# Patient Record
Sex: Male | Born: 1935 | Race: White | Hispanic: No | Smoking: Former smoker
Health system: Southern US, Community
[De-identification: ages and names within clinical notes are randomized; demographics above are authoritative.]

## PROBLEM LIST (undated history)

## (undated) DIAGNOSIS — I2510 Atherosclerotic heart disease of native coronary artery without angina pectoris: Secondary | ICD-10-CM

## (undated) DIAGNOSIS — I10 Essential (primary) hypertension: Secondary | ICD-10-CM

## (undated) DIAGNOSIS — F419 Anxiety disorder, unspecified: Secondary | ICD-10-CM

## (undated) DIAGNOSIS — F0390 Unspecified dementia without behavioral disturbance: Secondary | ICD-10-CM

## (undated) DIAGNOSIS — I219 Acute myocardial infarction, unspecified: Secondary | ICD-10-CM

## (undated) DIAGNOSIS — H269 Unspecified cataract: Secondary | ICD-10-CM

## (undated) DIAGNOSIS — E785 Hyperlipidemia, unspecified: Secondary | ICD-10-CM

## (undated) DIAGNOSIS — I6529 Occlusion and stenosis of unspecified carotid artery: Secondary | ICD-10-CM

## (undated) DIAGNOSIS — E119 Type 2 diabetes mellitus without complications: Secondary | ICD-10-CM

## (undated) DIAGNOSIS — F5104 Psychophysiologic insomnia: Secondary | ICD-10-CM

## (undated) DIAGNOSIS — M1990 Unspecified osteoarthritis, unspecified site: Secondary | ICD-10-CM

## (undated) HISTORY — DX: Anxiety disorder, unspecified: F41.9

## (undated) HISTORY — DX: Unspecified osteoarthritis, unspecified site: M19.90

## (undated) HISTORY — DX: Psychophysiologic insomnia: F51.04

## (undated) HISTORY — DX: Essential (primary) hypertension: I10

## (undated) HISTORY — DX: Acute myocardial infarction, unspecified: I21.9

## (undated) HISTORY — DX: Occlusion and stenosis of unspecified carotid artery: I65.29

## (undated) HISTORY — DX: Atherosclerotic heart disease of native coronary artery without angina pectoris: I25.10

## (undated) HISTORY — DX: Hyperlipidemia, unspecified: E78.5

## (undated) HISTORY — PX: KNEE ARTHROSCOPY: SUR90

## (undated) HISTORY — PX: CORONARY ARTERY BYPASS GRAFT: SHX141

## (undated) HISTORY — PX: CATARACT EXTRACTION: SUR2

## (undated) HISTORY — DX: Unspecified dementia, unspecified severity, without behavioral disturbance, psychotic disturbance, mood disturbance, and anxiety: F03.90

## (undated) HISTORY — PX: CAROTID ENDARTERECTOMY: SUR193

## (undated) HISTORY — DX: Unspecified cataract: H26.9

---

## 1998-12-11 ENCOUNTER — Ambulatory Visit (HOSPITAL_COMMUNITY): Admission: RE | Admit: 1998-12-11 | Discharge: 1998-12-11 | Payer: Self-pay | Admitting: Interventional Cardiology

## 1998-12-27 ENCOUNTER — Encounter: Payer: Self-pay | Admitting: Surgery

## 1998-12-28 ENCOUNTER — Encounter: Payer: Self-pay | Admitting: Surgery

## 1998-12-28 ENCOUNTER — Inpatient Hospital Stay (HOSPITAL_COMMUNITY): Admission: RE | Admit: 1998-12-28 | Discharge: 1999-01-06 | Payer: Self-pay | Admitting: Surgery

## 1998-12-29 ENCOUNTER — Encounter: Payer: Self-pay | Admitting: Surgery

## 1998-12-30 ENCOUNTER — Encounter: Payer: Self-pay | Admitting: Surgery

## 1998-12-31 ENCOUNTER — Encounter: Payer: Self-pay | Admitting: Surgery

## 1999-01-02 ENCOUNTER — Encounter: Payer: Self-pay | Admitting: Surgery

## 1999-01-04 ENCOUNTER — Encounter: Payer: Self-pay | Admitting: Surgery

## 1999-01-05 ENCOUNTER — Encounter: Payer: Self-pay | Admitting: Surgery

## 1999-01-06 ENCOUNTER — Encounter: Payer: Self-pay | Admitting: Surgery

## 2003-07-13 ENCOUNTER — Ambulatory Visit (HOSPITAL_COMMUNITY): Admission: RE | Admit: 2003-07-13 | Discharge: 2003-07-13 | Payer: Self-pay | Admitting: Internal Medicine

## 2003-09-15 ENCOUNTER — Ambulatory Visit (HOSPITAL_COMMUNITY): Admission: RE | Admit: 2003-09-15 | Discharge: 2003-09-15 | Payer: Self-pay | Admitting: Gastroenterology

## 2006-02-28 ENCOUNTER — Encounter: Admission: RE | Admit: 2006-02-28 | Discharge: 2006-02-28 | Payer: Self-pay | Admitting: Internal Medicine

## 2006-04-01 ENCOUNTER — Ambulatory Visit (HOSPITAL_COMMUNITY): Admission: RE | Admit: 2006-04-01 | Discharge: 2006-04-01 | Payer: Self-pay | Admitting: Vascular Surgery

## 2007-06-05 ENCOUNTER — Ambulatory Visit: Payer: Self-pay | Admitting: Vascular Surgery

## 2007-12-07 ENCOUNTER — Ambulatory Visit: Payer: Self-pay | Admitting: Surgery

## 2007-12-09 ENCOUNTER — Ambulatory Visit: Payer: Self-pay | Admitting: Vascular Surgery

## 2008-01-05 ENCOUNTER — Encounter: Admission: RE | Admit: 2008-01-05 | Discharge: 2008-01-05 | Payer: Self-pay | Admitting: Interventional Cardiology

## 2008-01-07 ENCOUNTER — Inpatient Hospital Stay (HOSPITAL_BASED_OUTPATIENT_CLINIC_OR_DEPARTMENT_OTHER): Admission: RE | Admit: 2008-01-07 | Discharge: 2008-01-07 | Payer: Self-pay | Admitting: Interventional Cardiology

## 2008-01-14 ENCOUNTER — Inpatient Hospital Stay (HOSPITAL_COMMUNITY): Admission: RE | Admit: 2008-01-14 | Discharge: 2008-01-15 | Payer: Self-pay | Admitting: Interventional Cardiology

## 2008-01-19 ENCOUNTER — Encounter (INDEPENDENT_AMBULATORY_CARE_PROVIDER_SITE_OTHER): Payer: Self-pay | Admitting: Interventional Cardiology

## 2008-01-19 ENCOUNTER — Ambulatory Visit (HOSPITAL_COMMUNITY): Admission: RE | Admit: 2008-01-19 | Discharge: 2008-01-19 | Payer: Self-pay | Admitting: Interventional Cardiology

## 2008-01-21 ENCOUNTER — Inpatient Hospital Stay (HOSPITAL_COMMUNITY): Admission: EM | Admit: 2008-01-21 | Discharge: 2008-02-04 | Payer: Self-pay | Admitting: Emergency Medicine

## 2008-01-22 ENCOUNTER — Ambulatory Visit: Payer: Self-pay | Admitting: Vascular Surgery

## 2008-01-22 ENCOUNTER — Ambulatory Visit: Payer: Self-pay | Admitting: Infectious Diseases

## 2008-02-18 ENCOUNTER — Ambulatory Visit: Payer: Self-pay | Admitting: *Deleted

## 2008-02-29 ENCOUNTER — Telehealth: Payer: Self-pay | Admitting: Infectious Disease

## 2008-03-08 ENCOUNTER — Encounter: Payer: Self-pay | Admitting: Infectious Disease

## 2008-03-21 ENCOUNTER — Ambulatory Visit: Payer: Self-pay | Admitting: Infectious Disease

## 2008-03-21 DIAGNOSIS — L03319 Cellulitis of trunk, unspecified: Secondary | ICD-10-CM

## 2008-03-21 DIAGNOSIS — I6529 Occlusion and stenosis of unspecified carotid artery: Secondary | ICD-10-CM

## 2008-03-21 DIAGNOSIS — I253 Aneurysm of heart: Secondary | ICD-10-CM | POA: Insufficient documentation

## 2008-03-21 DIAGNOSIS — L02219 Cutaneous abscess of trunk, unspecified: Secondary | ICD-10-CM

## 2008-03-21 DIAGNOSIS — I2510 Atherosclerotic heart disease of native coronary artery without angina pectoris: Secondary | ICD-10-CM | POA: Insufficient documentation

## 2008-03-23 HISTORY — PX: CAROTID ENDARTERECTOMY: SUR193

## 2008-03-25 ENCOUNTER — Encounter: Payer: Self-pay | Admitting: Infectious Disease

## 2008-03-30 ENCOUNTER — Ambulatory Visit: Payer: Self-pay | Admitting: Vascular Surgery

## 2008-04-18 ENCOUNTER — Encounter: Payer: Self-pay | Admitting: Vascular Surgery

## 2008-04-18 ENCOUNTER — Inpatient Hospital Stay (HOSPITAL_COMMUNITY): Admission: RE | Admit: 2008-04-18 | Discharge: 2008-04-20 | Payer: Self-pay | Admitting: Vascular Surgery

## 2008-04-18 ENCOUNTER — Ambulatory Visit: Payer: Self-pay | Admitting: Vascular Surgery

## 2008-04-22 ENCOUNTER — Encounter: Payer: Self-pay | Admitting: Infectious Disease

## 2008-05-04 ENCOUNTER — Ambulatory Visit: Payer: Self-pay | Admitting: Vascular Surgery

## 2008-11-09 ENCOUNTER — Ambulatory Visit: Payer: Self-pay | Admitting: Vascular Surgery

## 2009-01-31 IMAGING — CR DG CHEST 1V PORT
1 series · 1 of 1 positions shown · non-contrast
Comparison: 01/05/08.

CLINICAL DATA: Fever. Groin hematoma.
 PORTABLE CHEST - 1 VIEW:

[AP]
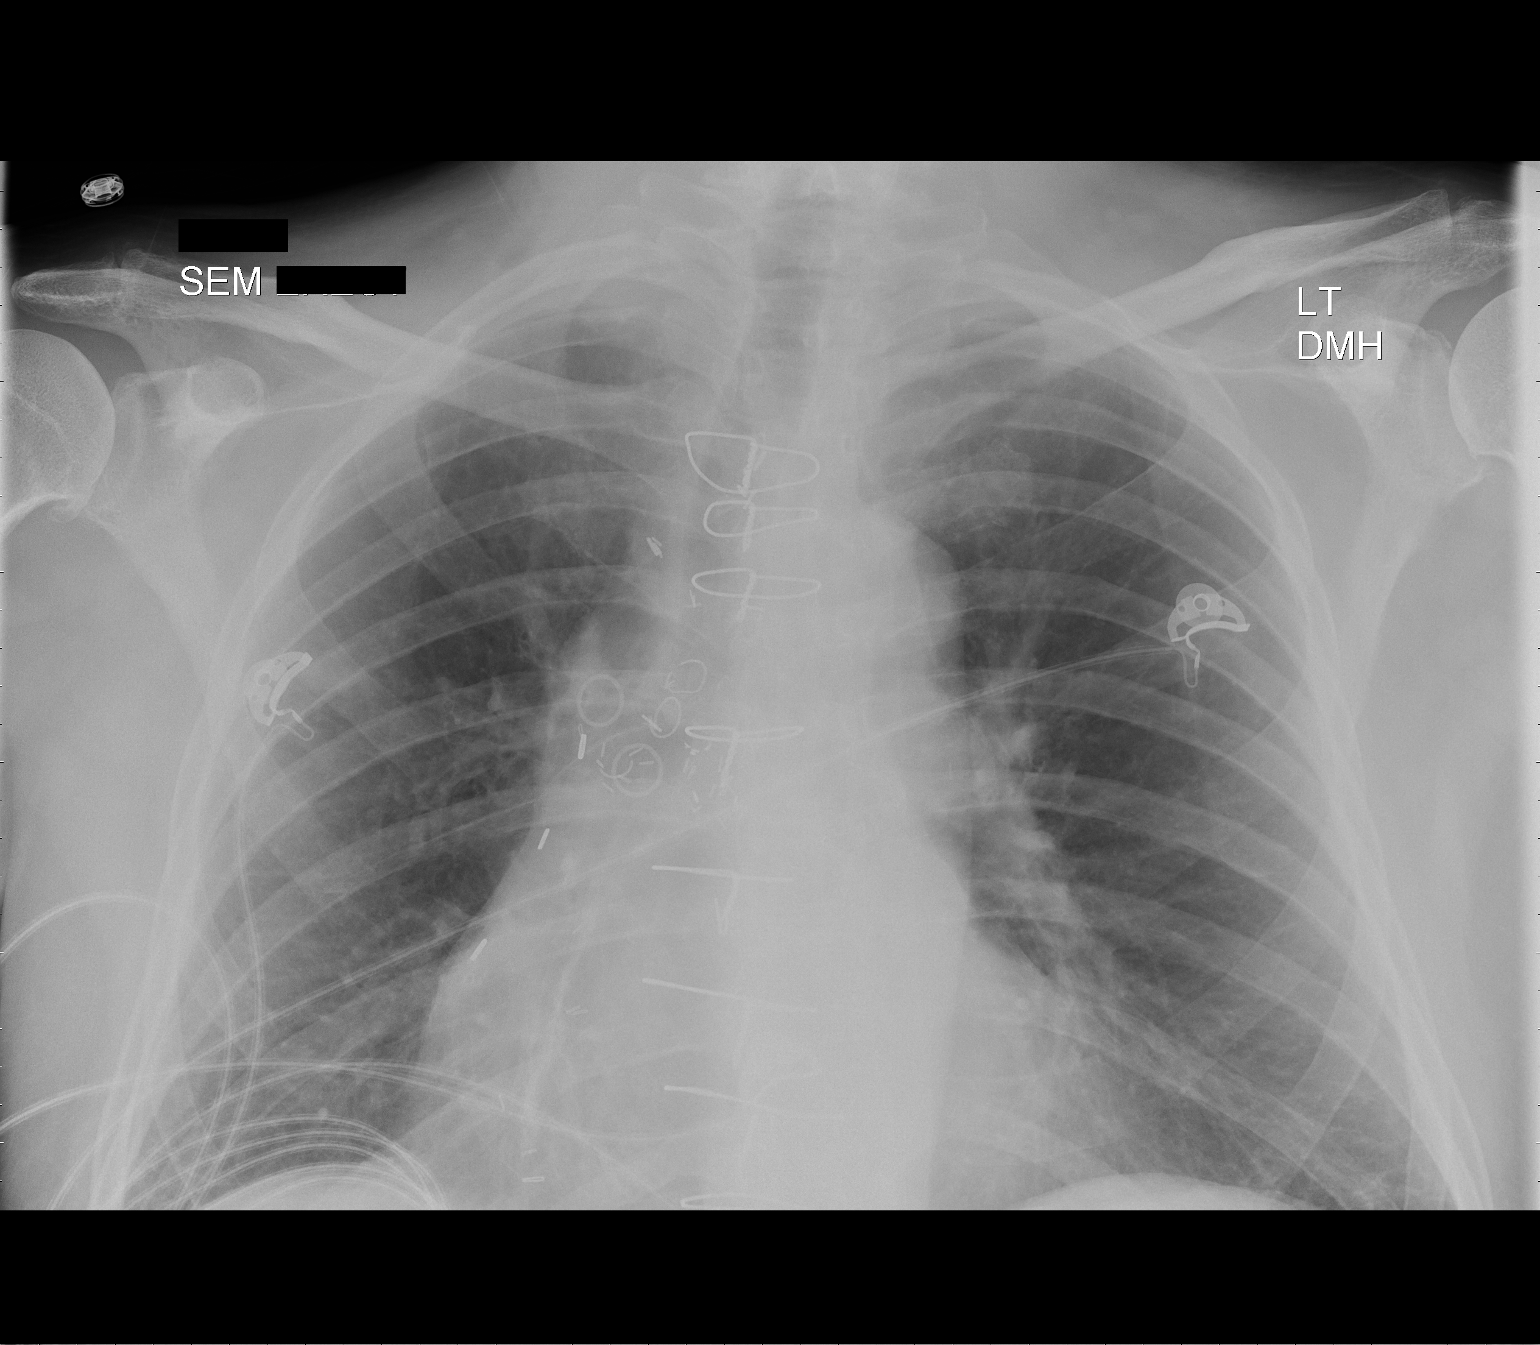

[1 of 1 positions shown; findings below may reference images not displayed]

FINDINGS: The lungs are clear. There is cardiomegaly.  No pleural effusion. No focal bony abnormality.
IMPRESSION: No acute disease or change.

## 2009-01-31 IMAGING — CT CT PELVIS W/O CM
3 of 4 series · 11 of 46 positions shown, 18 images · IV contrast (agent unspecified)
Comparison: none

CLINICAL DATA: Patient status-post cardiac catheterization with swelling of the right groin.  
PELVIS CT WITHOUT CONTRAST:
TECHNIQUE: Multidetector CT imaging of the pelvis was performed following the standard protocol without IV contrast.

[Series 2: routine pelvis · axial · 0.88mm/px · z∈[-316,-96]mm · 7 of 60 slices shown, 12 images]
[im 8/60  soft-tissue]
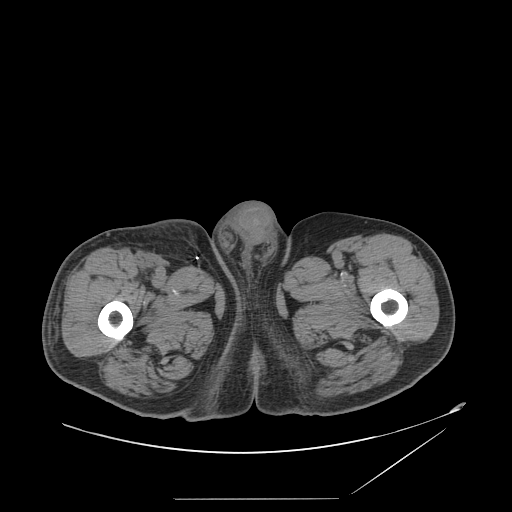
[im 8/60  bone]
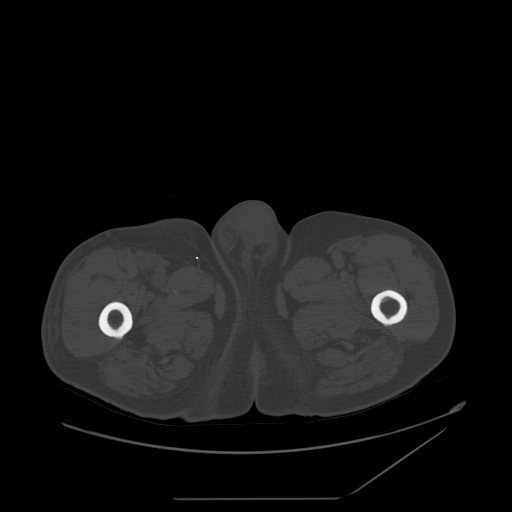
[im 15/60  soft-tissue]
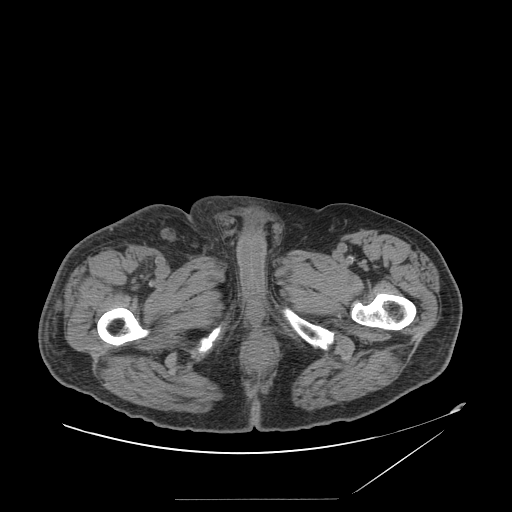
[im 23/60  soft-tissue]
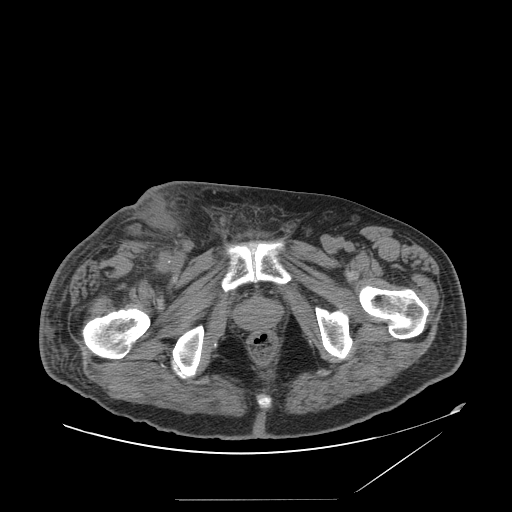
[im 30/60  soft-tissue]
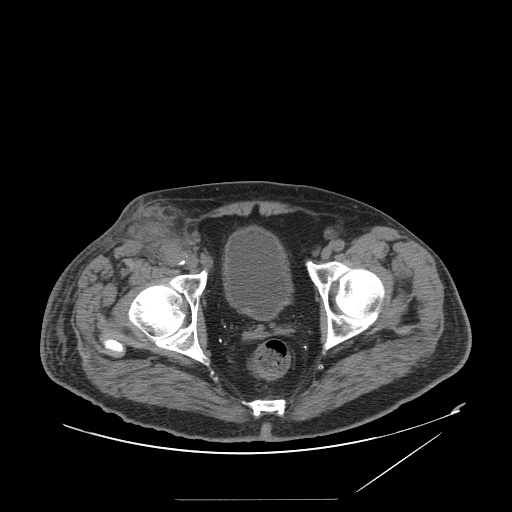
[im 30/60  lung]
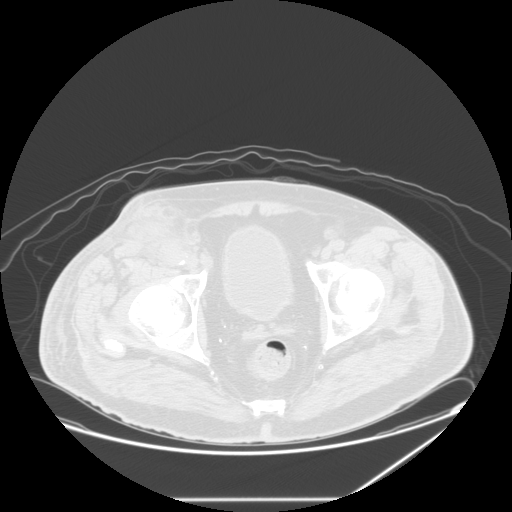
[im 37/60  soft-tissue]
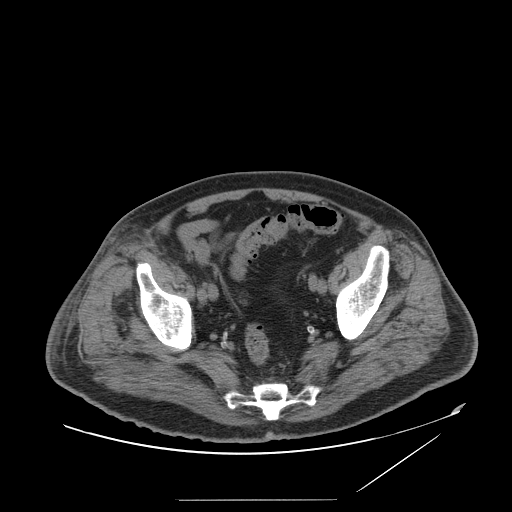
[im 37/60  lung]
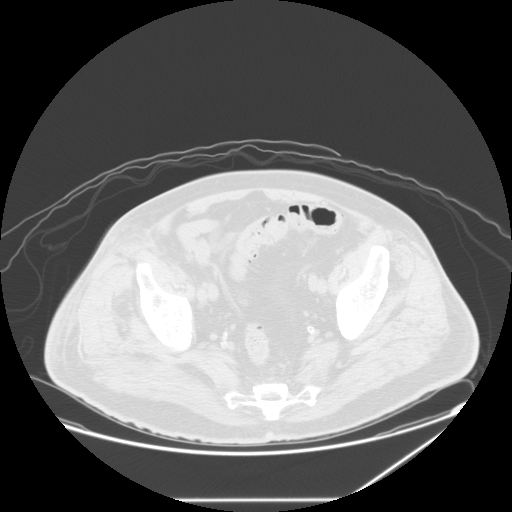
[im 45/60  soft-tissue]
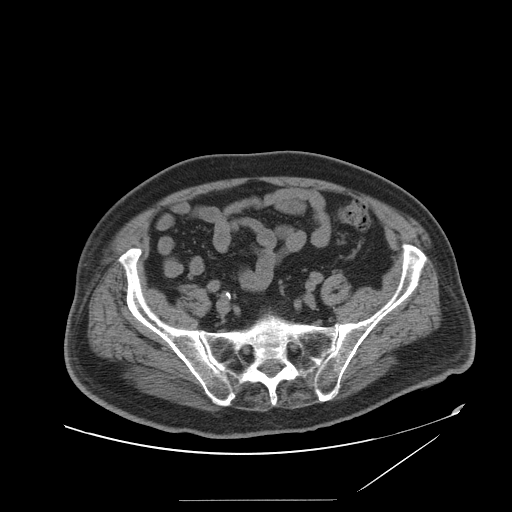
[im 45/60  lung]
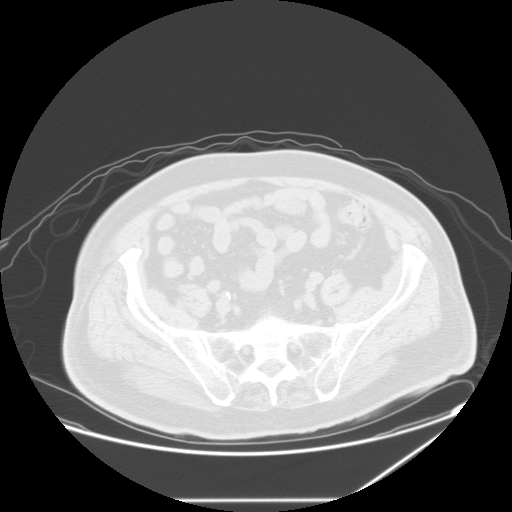
[im 52/60  soft-tissue]
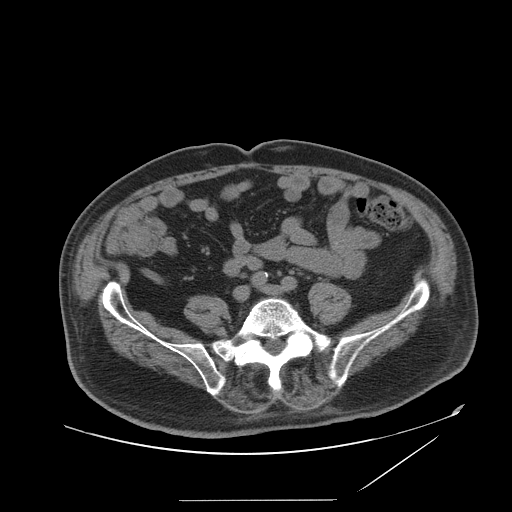
[im 52/60  lung]
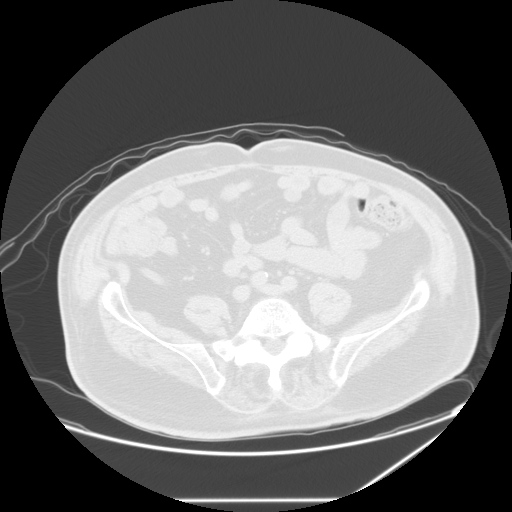

[Series 104: reformatted · sagittal · 0.88mm/px · 1 of 197 slices shown, 2 images (1 of 2)]
[im 66/197  soft-tissue]
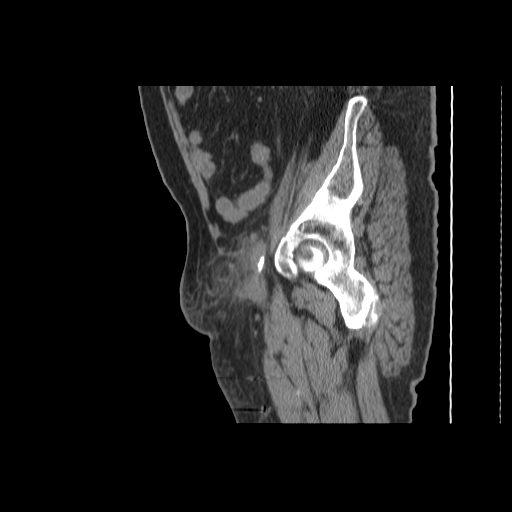
[im 66/197  bone]
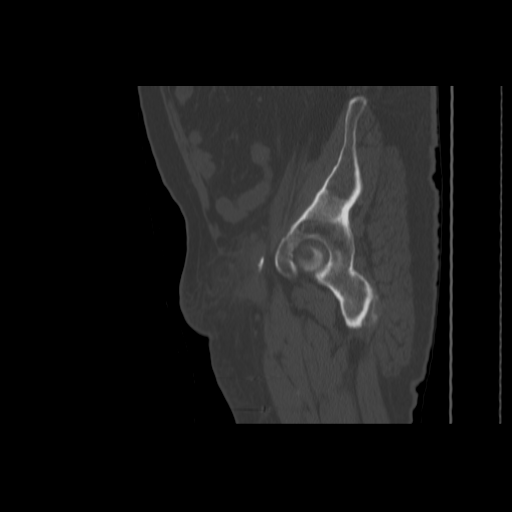

[Series 105: reformatted · coronal · 0.88mm/px · 3 of 155 slices shown, 4 images (2 of 2)]
[im 52/155  soft-tissue]
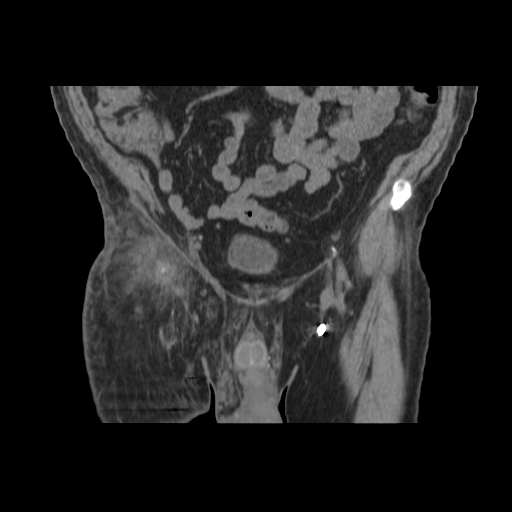
[im 69/155  soft-tissue]
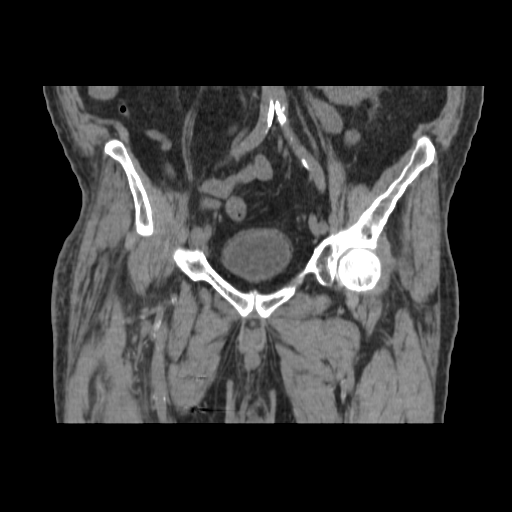
[im 69/155  bone]
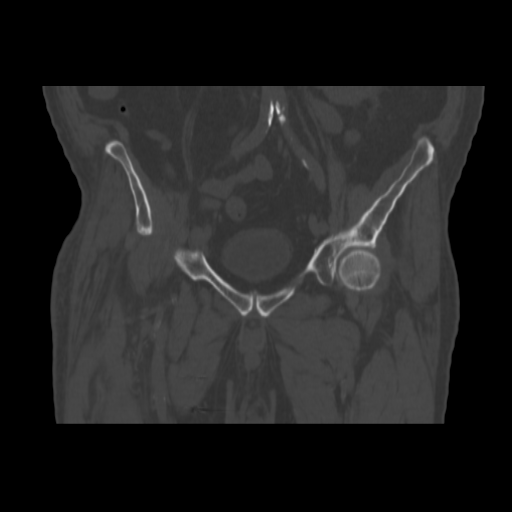
[im 86/155  soft-tissue]
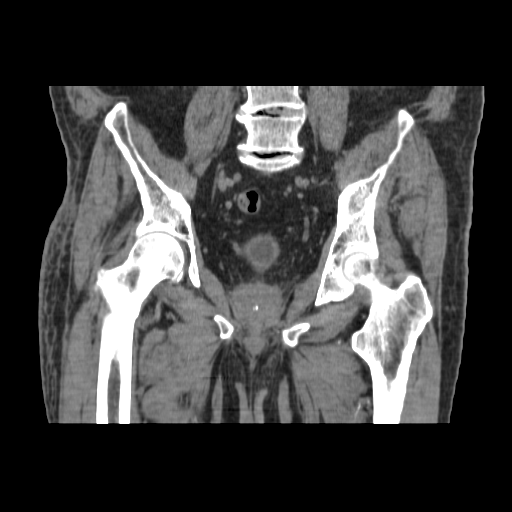

[11 of 46 positions shown; findings below may reference images not displayed]

FINDINGS: There is high attenuation material with surrounding infiltration of subcutaneous fat in the right groin, consistent with hematoma formation.  Overall, hematoma measures approximately 7.0cm craniocaudal x 4.2cm transverse x 3.0cm AP.  No extension into the pelvic cavity is present.  Intrapelvic contents demonstrate prostate calcifications.  Visualized bowel loops are unremarkable.  The pancreas is well seen and normal.  No lymphadenopathy or intrapelvic fluid collection.  No focal bony abnormality.
IMPRESSION: Right groin hematoma without evidence of organized abscess formation.  hlegmon formation is within the differential.

## 2009-05-10 ENCOUNTER — Ambulatory Visit: Payer: Self-pay | Admitting: Vascular Surgery

## 2009-10-25 ENCOUNTER — Ambulatory Visit: Payer: Self-pay | Admitting: Vascular Surgery

## 2010-11-14 ENCOUNTER — Ambulatory Visit: Payer: Self-pay | Admitting: Vascular Surgery

## 2011-05-07 NOTE — H&P (Signed)
NAMEIRISH, PIECH NO.:  [id number]   MEDICAL RECORD NO.:  [id number]          PATIENT TYPE:  INP   LOCATION:  6524                         FACILITY:  [facility]   PHYSICIAN:  Lyn Records, M.D.   DATE OF BIRTH:  [birth year]   DATE OF ADMISSION:  01/21/2008  DATE OF DISCHARGE:                              HISTORY & PHYSICAL   CHIEF COMPLAINT:  Right leg, groin pain.   HISTORY OF PRESENT ILLNESS:  [name] [name] is a pleasant 77 year old  male patient of Dr. Verdis Prime who presented to the cardiology office  on January 19, 2008, with right groin pain following cardiac  catheterization and stent placement on January 14, 2008.  The patient  was found to have a hematoma at that time that was approximately 3 x 11  cm in diameter.  There was some increased warmth as well as some  surrounding erythema and tenderness.  Duplex right groin arterial  ultrasound revealed no evidence of pseudoaneurysm and hematoma only.  He  was sent home on Augmentin and advised to return back today.  He was  found on the day of admission to have increased redness and warmth with  extension of erythema into the pubic area.  He is being admitted for IV  antibiotics as well as vascular and ID consult.   REVIEW OF SYSTEMS:  Review of systems as above.  A low-grade fever of  99.  No chills or sweats.  CHEST:  Denies any chest pain or pressure.  No palpitations.  RESPIRATORY: Denies cough, shortness of breath,  wheezing.  ABDOMEN:  Denies any abdominal discomfort.  No nausea or  vomiting.  No problems with stools.  The patient has had some anorexia  since being discharged.  GU:  Denies any dysuria, urinary frequency,  hematuria.  No perineal pain.  BACK:  Chronic low back pain, no  worsening.  MUSCULOSKELETAL:  As above.  He has had no overall leg  edema.  Ambulating is difficult to the right groin pain.  No lower  extremity edema.   PAST MEDICAL HISTORY:  1. Coronary artery disease, status  post CABG in 1988 with re-do in      1983.      a.     Status post drug eluting stent to SVG to obtuse marginal,       January 14, 2008.      b.     Cardiac catheterization January 07, 2008, showing stenosis       as above.  2. Insulin dependent diabetes mellitus.  3. Hypertension.  4. Dyslipidemia.  5. Carotid arterial disease.  6. Mild dementia.  7. Anxiety.  8. Chronic insomnia.   PAST SURGICAL HISTORY:  CABG 1981, 1993, PTCA in 1999.  Coronary  intervention with stent placement as above.  Arthroscopic right knee  surgery, left knee operation, bilateral cataract surgery.   SOCIAL HISTORY:  Widowed.  Has 4 children from a previous marriages.  Retired from [employer].   HABITS:  Smoking none, quit at age 56.  No alcohol use.  No illicit drug  use.  FAMILY HISTORY:  Father died at age 34 when the patient was young.  He  had lung cancer.  Mother died of MVA when the patient was found.  Two  sisters, twin brothers heart disease and CABG in the family.   ALLERGIES:  No drug allergies none known what   CURRENT MEDICATIONS:  1. Glucophage XR 500 mg two a day.  2. Lipitor 10 mg a day.  3. Metoprolol 25 mg b.i.d.  4. Lisinopril 20 mg daily.  5. Glipizide 5 mg b.i.d.  6. Furosemide 40 mg daily.  7. KCl 10 mEq daily.  8. Avandia 8 mg daily.  9. Lantus 20 units at bedtime daily.  10.Lorazepam 2 mg at bedtime daily.  11.Nitroglycerin 0.4 mg sublingual p.r.n.  12.Ecotrin 325 mg daily.  13.Plavix 75 mg daily.  14.Augmentin 875 mg b.i.d.  15.Hydrocodone 5/500 two p.o. q.i.d. p.r.n. pain.   OBJECTIVE DATA:  VITAL SIGNS:  Temperature 98.2, pulse 80, blood  pressure 110/60.  GENERAL:  Very pleasant, pale, but in no acute distress.  SKIN:  Warm and dry.  NECK:  Soft carotid bruit on the right.  There is no JVD.  No  adenopathy.  CHEST:  Normal S1-S2 without murmur, S3, S4, rub, click or gallop.  LUNGS:  Good excursion.  Faint crackles in the bases, otherwise clear.  No  wheezing.  ABDOMEN:  Soft and protuberant.  Bowel sounds present all four  quadrants.  No abdominal bruits auscultated.  No groin bruits  auscultated.  Abdomen is nontender.  No masses or hepatosplenomegaly.  GU:  Examination of the upper genitalia reveals some slight pinkness  just above the penile area.  There is some increased warmth in this  area.  EXTREMITIES:  Upper extremities +2 bilaterally with no joint deformity  or swelling.  Examination of the right groin reveals a hematoma that is  stable from a prior assessment 2 days ago.  There is however some  increased tenderness and redness that extends around the site to the  lower pelvic site.  There also appears to be at the main site a small  blister versus an area of necrosis.  Examination of the legs reveal no  overall edema.  There are no cords felt.  Negative Homan sign on the  right.  Pulses +2 distal.  No peripheral edema.   IMPRESSION:  1. Right groin hematoma and cellulitis following cardiac      catheterization, as above.  2. Coronary artery disease, stable.  3. Insulin dependent diabetes mellitus.  4. Right carotid arterial disease.  5. Hypertension.  6. Dyslipidemia.   PLAN:  Admit to telemetry bed.  Obtain CBC with diff, other labs.  Obtain blood cultures x2.  Initiate Cipro IV b.i.d. as well as  vancomycin IV per pharmacy protocol.  Will obtain ID consult as well as  vascular consult.  Dr. Katrinka Blazing and associates to follow.      Tamera C. Lewis, N.P.      Lyn Records, M.D.  Electronically Signed    TCL/MEDQ  D:  01/21/2008  T:  01/22/2008  Job:  [id number]

## 2011-05-07 NOTE — Procedures (Signed)
CAROTID DUPLEX EXAM   INDICATION:  Followup, known carotid artery disease.   HISTORY:  Diabetes:  Yes.  Cardiac:  Yes.  Hypertension:  Yes.  Smoking:  No.  Previous Surgery:  No.  CV History:  Amaurosis Fugax No, Paresthesias No, Hemiparesis No                                       RIGHT             LEFT  Brachial systolic pressure:         160               150  Brachial Doppler waveforms:         Biphasic          Biphasic  Vertebral direction of flow:        Antegrade         Not well  visualized  DUPLEX VELOCITIES (cm/sec)  CCA peak systolic                   143               128  ECA peak systolic                   348               129  ICA peak systolic                   390               148  ICA end diastolic                   124               45  PLAQUE MORPHOLOGY:                  Heterogenous      Heterogenous  PLAQUE AMOUNT:                      Severe            Moderate  PLAQUE LOCATION:                    ICA, ECA          ICA, ECA   IMPRESSION:  1. Approximately 80-99% stenosis at a lower range noted in the right      internal carotid artery.  2. 40-59% stenosis noted in the left internal carotid artery.  3. Antegrade right vertebral arteries.   ___________________________________________  Janetta Hora Fields, MD   MG/MEDQ  D:  12/07/2007  T:  12/08/2007  Job:  [id number]

## 2011-05-07 NOTE — Assessment & Plan Note (Signed)
OFFICE VISIT   ARLYNN, MCDERMID  DOB:  Dec 16, 1929                                       05/04/2008  ZOXWR#:[id number]   The patient returns for followup today after his carotid endarterectomy  on 04/18/2008.  He had an uneventful hospital stay.   PHYSICAL EXAMINATION:  Vital signs:  On exam today blood pressure is  155/67 in the left arm, 153/69 in the right arm, pulse is 59 and  regular.  Neck:  His right neck incision is well healed.  Neurological:  He is neurologically intact except for a small patch of numbness  underneath his right jaw.  He does have some very mild right tongue  deviation.  He states he had mild difficulty swallowing on discharge  from the hospital but this has significantly improved at this point.  He  has had no weakness in his upper or lower extremities or slurred speech.  He has had no episodes of amaurosis.   Overall, the patient looks good.  He will continue to take his aspirin  and Plavix.  He will follow up with me in six months' time for a repeat  carotid duplex exam.   Janetta Hora. Fields, MD  Electronically Signed   CEF/MEDQ  D:  05/04/2008  T:  05/05/2008  Job:  [id number]   cc:   Georgann Housekeeper, MD  Lyn Records, M.D.

## 2011-05-07 NOTE — Assessment & Plan Note (Signed)
OFFICE VISIT   IZZY, COURVILLE  DOB:  Aug 23, 1929                                       12/09/2007  ZOXWR#:[id number]   The patient is a 77 year old male whom I have followed for several years  for asymptomatic moderate carotid stenosis.  He was last seen in  November of 2007.  At that time, he was asymptomatic from his carotid  stenosis.  He continues to deny any symptoms of TIA, amaurosis, or  stroke.  He is currently taking 1 aspirin daily.  His atherosclerotic  risk factors continue to include diabetes and elevated cholesterol.   PAST SURGICAL HISTORY:  Remarkable for a left knee arthroscopy.  He has  also had coronary artery bypass grafting and redo grafting x2 other  occasions.  He has not had any chest pain recently.   MEDICATIONS:  Metformin 500 mg 1 in the morning and 1 at night.  Lipitor  10 mg once a day.  Metoprolol 50 mg 1/2 tablet in the morning and 1/2  tablet at night.  Lisinopril 20 mg every morning.  Glipizide 5 mg 1 in  the morning 1 in the evening.  Furosemide 40 mg in the morning.  Potassium chloride 20 mEq in the morning.  Avandia 8 mg once a day.  Lantus insulin 20 units nightly.  Nitroglycerin p.r.n.  He has no known  drug allergies.   PHYSICAL EXAM:  Blood pressure 168/74, pulse 54 and regular.  HEENT is  unremarkable.  He has no carotid bruits.  Chest is clear to  auscultation.  Cardiac exam is regular rate and rhythm.  He has no  murmur.  Abdomen is obese, soft, nontender, nondistended.  He has 2+  femoral and radial pulses bilaterally.  Neuro exam shows symmetric upper  extremity and lower extremity motor strength.   Carotid duplex exam performed on December 15 shows a high-grade, greater  than 80% stenosis of the right internal carotid artery with a peak  systolic velocity of 290 cm per second and diastolic of 124 cm per  second.  Left side was 40-60% stenosis.  He had an antegrade right  vertebral artery.  Left vertebral  artery was not well visualized.   Although the patient remains asymptomatic and denies any symptoms of  TIA, amaurosis, or stroke, his carotid stenosis has progressed over time  and I believe he would benefit from carotid endarterectomy for stroke  prophylaxis.  I described the risks, benefits, and possible  complications of procedure details with the patient today.  I have also  scheduled him for a cardiac evaluation by Dr. Katrinka Blazing to see if his  coronary vasculature needs any further evaluation prior to embarking on  carotid endarterectomy.  As soon as we hear back from Dr. Katrinka Blazing, we will  get him scheduled for right carotid endarterectomy.   Janetta Hora. Fields, MD  Electronically Signed   CEF/MEDQ  D:  12/10/2007  T:  12/10/2007  Job:  [id number]   cc:   Georgann Housekeeper, MD  Lyn Records, M.D.

## 2011-05-07 NOTE — Op Note (Signed)
NAME:  [name] [name], [name] [name]               ACCOUNT NO.:  [id number]   MEDICAL RECORD NO.:  [id number]          PATIENT TYPE:  INP   LOCATION:  3303                         FACILITY:  [facility]   PHYSICIAN:  Janetta Hora. Fields, MD  DATE OF BIRTH:  [birth year]   DATE OF PROCEDURE:  DATE OF DISCHARGE:                               OPERATIVE REPORT   PROCEDURE:  Right carotid endarterectomy.   PREOPERATIVE DIAGNOSIS:  High-grade asymptomatic right internal carotid  artery stenosis.   POSTOPERATIVE DIAGNOSIS:  High-grade asymptomatic right internal carotid  artery stenosis.   ANESTHESIA:  General.   ASSISTANT:  Wilmon Arms, PA-C.   OPERATIVE FINDINGS:  1. Greater than 80% stenosis, right internal carotid artery.  2. High carotid bifurcation with approximately one third of patch      above the level of the hypoglossal nerve.   OPERATIVE DETAILS:  After obtaining informed consent, the patient was  taken to the operating room.  The patient was placed in supine position  on operating room table.  After induction of general anesthesia and  endotracheal intubation, the patient's entire right neck and chest were  prepped and draped in usual sterile fashion.  An oblique incision was  made on the right side of the neck just anterior to the border of the  sternocleidomastoid muscle.  Incision was carried down through the  platysma and subcutaneous tissues.  Sternocleidomastoid muscle was  reflected laterally.  Dissection was carried down to the level of the  jugular vein.  Common facial vein was dissected free circumferentially  and ligated with silk ties.  Ansa cervicalis nerve was identified and  traced up to the level of hypoglossal nerve.  The ansa was divided at  the origin of the hypoglossal nerve.  Common carotid artery was  dissected free circumferentially and vagus nerve was identified  posteriorly and protected.  The patient had a fairly high carotid  bifurcation.  To expose the internal  carotid artery past the level of  stenosis, this required mobilization of several branches of the external  carotid artery followed by full mobilization of the hypoglossal nerve  followed by complete division of the posterior belly of digastric  muscle.  At this level, I was able to get around the internal carotid  artery and have an area free of plaque.  The external carotid and  superior thyroid arteries were dissected free circumferentially and  vessel loops were placed around these.  A vessel loop was also placed  around the internal carotid artery.  Umbilical tape was placed around  the common carotid artery.  The patient was then given 7000 units of  intravenous heparin.  Internal carotid artery was controlled with a fine  bulldog clamp.  The external carotid artery and superior thyroid were  controlled with vessel loops.  Common carotid artery was controlled with  a peripheral DeBakey clamp.  A longitudinal arteriotomy was made just  below the carotid bifurcation.  Arteriotomy was extended up through the  internal carotid artery and up past the level of disease.  The  arteriotomy was extended approximately 1.5 cm above  the level of the  hypoglossal nerve.  Next, a 10-French pediatric feeding tube was brought  up in the operative field and cut on each end to bevel it.  This was  then threaded into the distal internal carotid artery and allowed to  back bleed thoroughly.  It was then threaded down in the common carotid  artery and inspected for air.  After this was found be free of air, the  shunt was opened and restoration of the blood flow to the brain was  restored after approximately 4 minutes.  Proximal shunt was secured with  a Rumel tourniquet.  Next, endarterectomy was begun in a suitable plane  near the carotid bifurcation.  A good distal endpoint was obtained.  The  stenosis was greater than 80% and heavily calcified.  Eversion  endarterectomy was performed of the external  carotid artery.  A nice  feathered proximal endpoint was also obtained.  Next, a Dacron patch was  brought up in the operative field.  Dacron patch angioplasty was  performed with approximately 30% of the patch extending above the level  of hypoglossal nerve.  A patch angioplasty was performed with a running  6-0 Prolene suture.  Just prior to completion of the patch, the shunt  was reclamped and pulled out of the distal internal carotid artery,  which was then controlled with a fine bulldog clamp.  This was allowed  to back bleed thoroughly.  The shunt was then removed from the common  carotid artery and flushed, and then reoccluded with a peripheral  DeBakey clamp.  The external carotid and superior thyroid arteries were  secured and back bled.  Everything was thoroughly irrigated with  heparinized saline.  The remainder of the patch was completed.  The  external carotid artery was opened and flow first restored from the  common carotid to the external carotid artery and after approximately 5  cardiac cycles, the internal carotid artery flow was reopened.  This was  inspected with a Doppler and found to have good flow in the external,  internal, and common carotid arteries.  Hemostasis was then obtained  with 60 mg of protamine, thrombin, Gelfoam, and direct pressure.  Next,  the platysmal muscle was reapproximated using running 3-0 Vicryl suture.  The skin was closed with 4-0 Vicryl subcuticular stitch.  The patient  tolerated the procedure well and no noted  complications.  The  instrument, sponge, and needle counts were correct at the end of the  case.  The patient was taken to the recovery room in stable condition.      Janetta Hora. Fields, MD  Electronically Signed     CEF/MEDQ  D:  04/18/2008  T:  04/19/2008  Job:  [id number]

## 2011-05-07 NOTE — Assessment & Plan Note (Signed)
OFFICE VISIT   ZAYON, TRULSON  DOB:  [birth year]                                       02/18/2008  ZOXWR#:[id number]   HISTORY:  The patient underwent repair of a right femoral pseudoaneurysm  and drainage of right groin abscess on 02/01/2008.  He presents today  for postoperative evaluation.   BP 169/77, pulse 58 per minute regular, respirations 18 per minute, O2  saturation 98%, temperature 96.8.  right groin is healing well.  This is  now completely closed.  Suture will be removed today.  Discontinuation  of IV antibiotics per Dr. Daiva Eves of the ID service.   Follow up with Dr. Darrick Penna in approximately 4-6 weeks for planning of  carotid surgery.   Balinda Quails, M.D.  Electronically Signed   PGH/MEDQ  D:  02/18/2008  T:  02/20/2008  Job:  [id number]   cc:   Lyn Records, M.D.

## 2011-05-07 NOTE — Assessment & Plan Note (Signed)
OFFICE VISIT   [name] [name], [name] [name]  DOB:  1929-11-20                                       03/30/2008  ZOXWR#:[id number]   The patient is a 77 year old male who I have been following for some  time for asymptomatic moderate carotid stenosis.  This has recently  become a higher grade stenosis greater than 80% on his last carotid  duplex 4 months ago.  He underwent a cardiology evaluation in  preparation for possible right carotid endarterectomy.  This was  complicated post procedure by a hematoma and pseudoaneurysm in the right  groin which required repair.  Since then he has now recovered from this.  He is currently taking Plavix and aspirin.   MEDICATIONS:  1. Include glipizide 5 mg twice a day.  2. Potassium 20 mEq once a day.  3. Avandia 8 mg once a day.  4. Lantus insulin 20 units in the evening.  5. Plavix 75 mg once a day.  6. Lipitor 10 mg once a day.  7. Lopressor 25 mg twice a day.  8. Aspirin 325 mg once a day.  9. Glucophage 500 mg two tablets daily.  10.Lasix 40 mg once a day.  11.Lisinopril 20 mg once a day.   DRUG ALLERGIES:  He has no known drug allergies.   PAST SURGICAL HISTORY:  Relevant for left knee arthroscopy, coronary  artery bypass grafting and a re-do grafting.   PAST MEDICAL HISTORY:  Remarkable for his diabetes, elevated cholesterol  and hypertension.   SOCIAL HISTORY:  He is widowed.  He has 4 children.  He is retired.  He  is a former smoker but quit 30 years ago.  He does not consume alcohol  regularly.   PHYSICAL EXAMINATION:  Vital signs:  On physical examination blood  pressure is 170/84, heart rate 58 and regular.  HEENT:  Unremarkable.  He has 2+ carotid pulses with a right-sided carotid bruit.  Chest:  Clear to auscultation.  Cardiovascular:  Regular rate and rhythm.  Abdomen:  Soft.  Nontender.  No masses.  He has an essentially healed  right groin incision with 2 small sinus tracts at the central aspect.  There is no expression of drainage today.  He has 2+ femoral pulses  bilaterally.  Neurologic:  Exam shows symmetric upper extremity and  lower extremity motor strength which is 5/5 bilaterally.   I reviewed the patient's carotid duplex exam dated December 07, 2007.  This showed a high-grade right internal carotid artery stenosis with a  peak systolic velocity of 390 cm per second and an end diastolic  velocity of 124 cm per second.   I believe the patient has now recovered sufficiently from his recent  coronary intervention and repair of his pseudoaneurysm to consider right  carotid endarterectomy.  We have scheduled this for him on April 18, 2008.  Risks, benefits, possible complications and procedure details  were discussed with the patient today.  These were included but not  limited to bleeding, infection, cranial nerve injury and stroke.  He  understands and agrees to proceed.  I told him he should discontinue his  aspirin 1 week prior to the procedure.  He will continue to take his  Plavix through the time of the procedure.   Janetta Hora. Fields, MD  Electronically Signed   CEF/MEDQ  D:  03/30/2008  T:  03/31/2008  Job:  [id number]   cc:   Georgann Housekeeper, MD  Lyn Records, M.D.

## 2011-05-07 NOTE — Procedures (Signed)
CAROTID DUPLEX EXAM   INDICATION:  Follow-up evaluation of known carotid artery disease.   HISTORY:  Diabetes:  Yes.  Cardiac:  Coronary artery bypass graft in 1981, 1993, and 2000.  Hypertension:  Yes.  Smoking:  No.  Previous Surgery:  Right carotid endarterectomy in April, 2009 by Dr.  Darrick Penna.  CV History:  Previous duplex on 11/09/08 revealed an occluded left  vertebral artery and no right ICA stenosis, status post endarterectomy,  40-59% left ICA stenosis.  Amaurosis Fugax No, Paresthesias No, Hemiparesis No.                                       RIGHT             LEFT  Brachial systolic pressure:         168               154  Brachial Doppler waveforms:         Biphasic          Biphasic  Vertebral direction of flow:        Inaudible         Inaudible  DUPLEX VELOCITIES (cm/sec)  CCA peak systolic                   76                86  ECA peak systolic                   87                96  ICA peak systolic                   61                145  ICA end diastolic                   16                40  PLAQUE MORPHOLOGY:                  None              Mixed  PLAQUE AMOUNT:                      None              Moderate  PLAQUE LOCATION:                    None              Proximal ICA   IMPRESSION:  1. No right internal carotid artery stenosis, status post      endarterectomy.  2. 40-59% left internal carotid artery stenosis.  3. Inaudible vertebral arteries bilaterally suggest occlusion.   ___________________________________________  Janetta Hora Fields, MD   MC/MEDQ  D:  05/10/2009  T:  05/10/2009  Job:  [id number]

## 2011-05-07 NOTE — Procedures (Signed)
CAROTID DUPLEX EXAM   INDICATION:  Follow up known carotid artery disease   HISTORY:  Diabetes:  no  Cardiac:  CABG 1980 and in the 90s.  Hypertension:  yes  Smoking:  no  Previous Surgery:  Right carotid endarterectomy 03/2008  CV History:  no  Amaurosis Fugax No, Paresthesias No, Hemiparesis No                                       RIGHT             LEFT  Brachial systolic pressure:         150               155  Brachial Doppler waveforms:         wnl               wnl  Vertebral direction of flow:        antegrade         Appears occluded  DUPLEX VELOCITIES (cm/sec)  CCA peak systolic                   91                78  ECA peak systolic                   99                90  ICA peak systolic                   72                177  ICA end diastolic                   14                29  PLAQUE MORPHOLOGY:                                    heterogenous  PLAQUE AMOUNT:                                        Mild to moderate  PLAQUE LOCATION:                                      ICA   IMPRESSION:  1. Normal right internal carotid artery status post right carotid      endarterectomy.  2. Left internal carotid artery appears with 20% to 39% stenosis.  3. Antegrade right vertebral.  4. Left vertebral appears occluded.   ___________________________________________  Janetta Hora. Fields, MD   CB/MEDQ  D:  10/25/2009  T:  10/26/2009  Job:  [id number]

## 2011-05-07 NOTE — Discharge Summary (Signed)
NAMEADOLF, ORMISTON NO.:  [id number]   MEDICAL RECORD NO.:  [id number]          PATIENT TYPE:  OIB   LOCATION:  6532                         FACILITY:  [facility]   PHYSICIAN:  Lyn Records, M.D.   DATE OF BIRTH:  December 03, 1929   DATE OF ADMISSION:  01/14/2008  DATE OF DISCHARGE:  01/21/2008                               DISCHARGE SUMMARY   DISCHARGE DIAGNOSES:  1. Coronary artery disease, status post drug-eluting stent to the      saphenous vein graft to the obtuse marginal.  2. Hypertension.  3. Diabetes mellitus.  4. Dyslipidemia.  5. Osteoarthritis.  6. Known coronary artery disease, history of bypass grafting in 1988      with re-do surgery in 1993.   HOSPITAL COURSE:  Mr. Moure comes in on January 14, 2008 for a  preplanned percutaneous intervention to the above listed vessel.  He  initially underwent cardiac catheterization on January 07, 2008  following an abnormal Cardiolite study.  He was found to have stenosis  of the saphenous vein graft to the obtuse marginal.   He was brought back to the cardiac catheterization lab on January 14, 2008 and underwent percutaneous intervention utilizing a drug-eluting  stent to the saphenous vein graft to the OM without difficulty.  The  patient remained in the hospital overnight for observation.   LABORATORY DATA:  BUN 15, creatinine 0.82, potassium 3.7, hemoglobin  12.1, hematocrit 36.1, platelets 125.   DISCHARGE MEDICATIONS:  1. Plavix 75 mg a day for a year minimum.  2. Enteric-coated aspirin 325 mg a day forever.  3. Lipitor 10 mg a day.  4. Metoprolol 25 mg twice a day.  5. Lisinopril 20 mg a day.  6. Glipizide 5 mg twice a day.  7. Lasix 40 mg a day.  8. Potassium 20 mEq once a day.  9. Avandia 8 mg a day.  10.Lantus 28 units at bedtime as prior to admission.  11.Sublingual nitroglycerin p.r.n. chest pain.  12.Lorazepam 2 mg at bedtime as needed as prior to admission.  13.Restart Glucophage on  January 18, 2008.   DIET:  Remain on a low-sodium, heart-healthy diabetic diet.   DISCHARGE INSTRUCTIONS:  1. Clean cath site gently with soap and water.  No scrubbing.  2. No driving for 2 days.  3. No lifting over 10 pounds for 1 week.  4. Follow up with Dr. Corie Chiquito, nurse practitioner on      January 28, 2008 at 1:30 p.m.      Guy Franco, P.A.      Lyn Records, M.D.  Electronically Signed    LB/MEDQ  D:  01/15/2008  T:  01/15/2008  Job:  [id number]   cc:   Lyn Records, M.D.  Janetta Hora. Darrick Penna, MD

## 2011-05-07 NOTE — Cardiovascular Report (Signed)
NAME:  [name] [name], [name] [name] NO.:  [id number]   MEDICAL RECORD NO.:  [id number]          PATIENT TYPE:  OIB   LOCATION:  1962                         FACILITY:  [facility]   PHYSICIAN:  Lyn Records, M.D.   DATE OF BIRTH:  [birth year]   DATE OF PROCEDURE:  DATE OF DISCHARGE:                            CARDIAC CATHETERIZATION   INDICATION FOR THE STUDY:  Abnormal myocardial perfusion study that was  being done to establish risk for upcoming carotid endarterectomy by Dr.  Darrick Penna.  The entire lateral wall was ischemic on the Cardiolite study.  The patient has a history coronary artery bypass surgery on 3 prior  occasions.  The most recent operation was in 2000.  At that time, he had  a saphenous vein graft to the PDA and a free LIMA graft to the distal  LAD.  He also had a patent saphenous vein graft to the circumflex  territory from his 91 operation.   PROCEDURES PERFORMED:  1. Left heart catheterization.  2. Selective coronary angiography.  3. Left ventriculography.  4. Saphenous vein graft angiography.  5. Free left internal mammary artery graft angiography.   DESCRIPTION:  After informed consent, a 4-French sheath was placed in  the femoral artery on the right side using the modified Seldinger  technique.  Xylocaine 1% was used for local infiltration and anesthesia.  A 4-French A2 multipurpose catheter was then used for hemodynamic  recordings, left ventriculography by hand injection, and selective  engagement of the free LIMA graft, the saphenous vein graft to the  obtuse marginal, and the saphenous vein graft to the PDA.  It also  demonstrated several aortic stumps that were indicative of total  occlusion of prior saphenous vein graft conduits.  We used the  multipurpose catheter for left ventriculography by hand injection.  We  then used preformed Judkins catheters for native right coronary and  native left coronary angiography.  Both were #4 Judkins type  specific  right and left catheters.  The case was then terminated.   RESULTS:  1. Hemodynamic data      a.     Aortic pressure 127/46.      b.     Left ventricular pressure 125/12.  2. Left ventriculography:  Overall, LV function is normal.  EF is 55%      to 60%.  3. Coronary angiography.      a.     Left main coronary:  Patent.      b.     Left anterior descending coronary:  Occluded.      c.     Circumflex artery:  Occluded.      d.     Right coronary artery:  Occluded.  4. Bypass graft angiography.      a.     Saphenous vein graft to the obtuse marginal:  This graft       contains mid vessel 50% narrowing, proximal vessel 40% narrowing,       and 99% distal narrowing.  The obtuse marginal is moderate in       size, giving a  branching lateral wall vessel.      b.     Saphenous vein graft to PDA:  This graft is widely patent       with scattered irregularities noted in the body of the graft.  5. Free right internal mammary artery graft to the LAD:  This graft is      widely patent to the LAD.  This is diffusely diseased, proximal and      distal to the graft insertion site.   CONCLUSION:  1. The patient contains severe disease in the saphenous vein graft and      the circumflex.  This likely accounts for the severity and the      widespread nature of ischemia in the lateral wall on the Cardiolite      study.  2. Patent saphenous vein graft to the posterior descending artery and      free right internal mammary artery to the left anterior descending.  3. Total occlusion of the native left anterior descending, circumflex,      and right coronary.  4. Normal left ventricular function.   PLAN:  We will discuss with Dr. Darrick Penna the staging of the patient's  procedures.  PCI on the circumflex graft will require continuation of  dual antiplatelet therapy for a year.  It may be advisable for him to go  ahead and operate on the patient's carotid and then let us perform a PCI   thereafter since the circumflex territory is relatively small with  reference to the specific graft that we will be working on and I will  discuss this with him and we will go forward from there.      Lyn Records, M.D.  Electronically Signed     HWS/MEDQ  D:  01/07/2008  T:  01/07/2008  Job:  [id number]   cc:   Janetta Hora. Darrick Penna, MD

## 2011-05-07 NOTE — Op Note (Signed)
[name], [name]               ACCOUNT NO.:  [id number]   MEDICAL RECORD NO.:  [id number]          PATIENT TYPE:  INP   LOCATION:  2037                         FACILITY:  [facility]   PHYSICIAN:  Balinda Quails, M.D.    DATE OF BIRTH:  [birth year]   DATE OF PROCEDURE:  02/01/2008  DATE OF DISCHARGE:                               OPERATIVE REPORT   SURGEON:  Balinda Quails, M.D.   ASSISTANT:  Jerold Coombe, P.A.   ANESTHETIC:  General endotracheal.   ANESTHESIOLOGIST:  Burna Forts, M.D.   PREOPERATIVE DIAGNOSIS:  Right femoral pseudoaneurysm.   POSTOPERATIVE DIAGNOSIS:  Right femoral pseudoaneurysm.   PROCEDURES:  1. Exploration right groin.  2. Repair of right femoral pseudoaneurysm with vein patch angioplasty.   CLINICAL NOTE:  [name] [name] is a 77 year old gentleman, status post  cardiac catheterization with percutaneous coronary stent placement.  He  was readmitted to the hospital with a right groin infection and fever.  CT scan initially showed a hematoma in the right groin.  He was treated  with intravenous antibiotics.  He developed a 4 x 5 cm area of skin  necrosis over the catheterization site.  Subsequent follow-up CT scan  revealed evidence of a false aneurysm from the right common femoral  artery.  He is therefore brought to the operating room at this time for  exploration of right groin, repair of false aneurysm, and debridement.   OPERATIVE PROCEDURE:  The patient was brought to the operating room in  stable condition.  Placed under general endotracheal anesthesia.  The  right leg was prepped and draped in a sterile fashion.   An oblique skin incision was made through the right groin encompassing  the area of skin necrosis in the central portion of the incision.  The  superficial necrotic skin was debrided.  Cultures for aerobic and  anaerobic bacteria were obtained.  Subcutaneous tissue divided and  lymphatics divided with electrocautery.  The  inguinal ligament mobilized  and retracted superiorly.  The right external iliac/common femoral  junction was exposed and encircled with a vessel loop.  The patient was  administered 5000 units of heparin intravenously.  Inflow into the false  aneurysm was controlled proximally with a DeBakey clamp.  False aneurysm  cavity was then entered.  There was a large defect in the lateral wall  of the right common femoral artery.  Retrograde bleeding was controlled  with digital pressure, and the common femoral artery dissected out  distal to the false aneurysm origin.  This was then encircled with a  vessel loop and controlled.  The arterial wall was debrided.   A tributary of the greater saphenous vein was then exposed in the right  groin.  The tributary was ligated proximally and distally with 3-0 silk,  divided, and opened longitudinally.  This provided an adequate vein for  patch angioplasty.  A patch angioplasty repair of the right common  femoral artery was then carried out with a running 6-0 Prolene suture,  using the segment of tributary of greater saphenous vein.  At completion  of  this, the artery was flushed.  Clamps were removed.  Excellent flow  present, and adequate hemostasis obtained.  The patient was administered  50 mg of protamine intravenously.   The subcutaneous tissues were then debrided of devitalized tissue, along  with debridement of the skin.  The subcutaneous tissue was then closed  with interrupted 2-0 Vicryl suture in multiple layers.  The skin was  then reapproximated with interrupted 3-0 nylon suture, leaving a small  central area of skin open for packing.   The patient tolerated the procedure well.  No apparent complications.  The patient was transferred to the recovery room in stable condition.      Balinda Quails, M.D.  Electronically Signed     PGH/MEDQ  D:  02/01/2008  T:  02/02/2008  Job:  [id number]   cc:   Lyn Records, M.D.

## 2011-05-07 NOTE — Discharge Summary (Signed)
NAME:  [name], [name]               ACCOUNT NO.:  [id number]   MEDICAL RECORD NO.:  [id number]          PATIENT TYPE:  INP   LOCATION:  3303                         FACILITY:  [facility]   PHYSICIAN:  Janetta Hora. Fields, MD  DATE OF BIRTH:  07-30-1929   DATE OF ADMISSION:  04/18/2008  DATE OF DISCHARGE:  04/20/2008                               DISCHARGE SUMMARY   DISCHARGE DIAGNOSES:  1. Right carotid occlusive disease.  2. Coronary artery disease status post coronary artery bypass      grafting.  3. Insulin-dependent diabetes mellitus.  4. Hypertension.  5. Dyslipidemia.  6. Mild dementia.  7. Anxiety.  8. Chronic insomnia.   PROCEDURE PERFORMED:  Right carotid endarterectomy, April 18, 2008.   COMPLICATIONS:  None.   DISCHARGE MEDICATIONS:  1. Plavix 75 mg p.o. daily.  2. Metformin 500 mg p.o. b.i.d.  3. Lipitor 10 mg p.o. daily.  4. Metoprolol 25 mg p.o. b.i.d.  5. Glipizide 5 mg p.o. daily.  6. Furosemide 40 mg p.o. daily.  7. Potassium 20 mEq p.o. daily.  8. Avandia 8 mg p.o. daily.  9. Lorazepam 2 mg p.o. nightly.  10.Systane 1 drop to the right eye as needed.  11.Xibrom 1 drop to the left eye b.i.d.  12.Lantus 20 units q.p.m. subcu.  13.Percocet 5/325 one p.o. q.4 hours p.r.n. pain, 20 were given.   DISPOSITION:  He is being discharged to home in stable condition with  his wound healing well.  He is given careful instructions regarding the  care of his wound.  He is to clean with soap and warm water.  He is to  observe the wound for drainage, increasing swelling, pain, redness,  fever greater than 101.2 or neurological changes.  He is given an  appointment to see Dr. Darrick Penna in 2 weeks.  Our office will call with the  appointment.   BRIEF IDENTIFYING STATEMENT:  For complete details, please refer to the  typed history and physical.  Briefly, this very pleasant 77 year old  gentleman was referred to Dr. Darrick Penna for evaluation of carotid arterial  disease.  Dr. Darrick Penna  found him to be a suitable candidate for right  carotid endarterectomy.  He was informed of the risks and benefits of  the procedure, and after careful consideration elected to proceed with  surgery.   HOSPITAL COURSE:  Preoperative workup was completed as an outpatient.  He was brought in through the same-day surgery and underwent the  aforementioned right carotid endarterectomy.  For complete details,  please refer to the typed operative report.  The procedure went without  complications.  He was returned to the post anesthesia care unit  extubated.  Following stabilization, he was transferred to a bed in a  surgical step-down unit.  The night of surgery and following day, he had  some blood pressure management issues mainly consisted mainly of  hypertension.  He remained in the hospital in the step-down surgical  unit until the second postoperative day.  At this time, he had been  weaned off all vasoactive drips.  He was felt stable and was desirous  of  discharge and was subsequently discharged.      Wilmon Arms, PA      Janetta Hora. Fields, MD  Electronically Signed    KEL/MEDQ  D:  04/20/2008  T:  04/20/2008  Job:  [id number]

## 2011-05-07 NOTE — Assessment & Plan Note (Signed)
OFFICE VISIT   TYTAN, SANDATE  DOB:  10-22-1929                                       11/09/2008  ZOXWR#:[id number]   The patient returns for followup today after his carotid endarterectomy  on 04/18/2008.  He has had no neurologic events since then.  The  numbness on the right side of his neck has completely resolved at this  point.  He continues to take his aspirin and Plavix.  His primary  atherosclerotic risk factors continue to include coronary artery  disease, diabetes, hypertension, dyslipidemia.   On physical exam today blood pressure is 154/71 in the right arm, 161/84  in the left arm, heart rate is 48 and regular.  He has a left carotid  bruit.  Right neck scar is well-healed with no bruit.  Upper extremity  and lower extremity motor strength is 5/5 and symmetric bilaterally.  He  has 2+ radial pulses bilaterally.   Overall the patient seems to be doing well.  He will follow up in six  months' time for repeat carotid duplex scan.  Today's carotid duplex  scan showed no recurrent stenosis of his right internal carotid artery  and he continues to have a 40-60% left internal carotid artery stenosis.   Janetta Hora. Fields, MD  Electronically Signed   CEF/MEDQ  D:  11/09/2008  T:  11/10/2008  Job:  [id number]   cc:   Georgann Housekeeper, MD  Lyn Records, M.D.

## 2011-05-07 NOTE — Discharge Summary (Signed)
[name], [name] NO.:  [id number]   MEDICAL RECORD NO.:  [id number]          PATIENT TYPE:  INP   LOCATION:  2037                         FACILITY:  [facility]   PHYSICIAN:  Lyn Records, M.D.   DATE OF BIRTH:  08/31/1929   DATE OF ADMISSION:  01/21/2008  DATE OF DISCHARGE:  02/04/2008                               DISCHARGE SUMMARY   DISCHARGE DIAGNOSIS:  1. Right groin abscess with necrosis status post left arm, status post      exploration by Dr. Jake Samples.  2. Right femoral artery pseudoaneurysm, status post repair by Dr.      Jake Samples.  3. Methicillin-sensitive Staphylococcus aureus, infected right groin,      with ongoing IV antibiotic therapy.  4. Coronary artery disease status post PCI of January 14, 2008,      (saphenous vein graft to the obtuse marginal).  5. Diabetes mellitus, insulin dependent.  6. Hypertension.  7. Dyslipidemia, treated as number long-term medication use.   HOSPITAL COURSE:  [name] [name] is a 77 year old male patient who  underwent cardiac catheterization with percutaneous intervention to the  saphenous vein graft to the obtuse marginal January 14, 2008.  During  that time, he was found to have a hematoma at that cath site, with  increased warmth and some redness, as well as tenderness.  A duplex of  the right groin revealed no evidence of pseudo aneurysm, and he was sent  home on Augmentin.   On January 21, 2008 he presented to the office with increased warmth or  redness of that area.  He was admitted for IV antibiotics.  He was seen  in consultation by two services, first Dr. Liliane Bade of the vascular  service saw the patient and helped [place] to treat his right groin necrosis  and pseudoaneurysm by taking him to the operating room and performing an  exploration of the right groin and repair of a right femoral artery  pseudoaneurysm with vein patch angioplasty.   Infectious disease physician, Dr. Paulette Blanch [place] was  also consulted,  and the area that was infected actually grew out methicillin-sensitive  Staphylococcus aureus.  The patient has been set up for IV antibiotic  therapy at home and must remain on it for 6 weeks.  Therefore, he will  go home on Cefazolin 2 grams IV q.12 h., until March 15, 2008.   MEDICATIONS:  His other medications have been unchanged, but include:  1. Glipizide 5 mg twice a day.  2. Potassium 20 mEq a day.  3. Avandia  8 mg a day.  4. Lantus insulin 20 units q.h.s.  5. Plavix 75 mg a day.  6. Lipitor 10 mg a day.  7. Lopressor 25 mg twice a day.  8. Enteric-coated aspirin 325 mg a day.  9. Glucophage 500 mg , 2 tablets daily.  10.Lasix 40 mg a day.  11.Lisinopril 20 mg a day.   He may utilized the lorazepam as needed.   Dressing changes by been arranged to the home health nursing office.  They will also help [place] administer  the every 8 hour antibiotic therapy.  He is to remain on a low-sodium, heart-healthy diabetic diet.  He is to  follow up with Dr. Madilyn Fireman in 3 weeks office will call him with this  appointment.  I will have him a return appointment to see Dr. Katrinka Blazing on  February 17, 2008 at 10:00 a.m.      [name] [name], P.A.      Lyn Records, M.D.  Electronically Signed    LB/MEDQ  D:  02/04/2008  T:  02/05/2008  Job:  [id number]

## 2011-05-07 NOTE — Cardiovascular Report (Signed)
NAME:  [name] [name], [name] [name] NO.:  [id number]   MEDICAL RECORD NO.:  [id number]          PATIENT TYPE:  OIB   LOCATION:  6532                         FACILITY:  [facility]   PHYSICIAN:  Lyn Records, M.D.   DATE OF BIRTH:  25-Nov-1929   DATE OF PROCEDURE:  01/14/2008  DATE OF DISCHARGE:                            CARDIAC CATHETERIZATION   INDICATION:  High-grade obstructions in the mid and distal portion of  the saphenous vein graft to the circumflex.   PROCEDURE PERFORMED:  Stenting of the saphenous vein graft, mid and  distal segments, with PROMUS drug-eluting stent.   DESCRIPTION:  The patient was brought to the cath lab in the  postabsorptive state.  Then, 1 mg of IV Versed was administered for  sedation.   A 6-French sheath was inserted into the right femoral artery using the  modified Seldinger technique after 1% Xylocaine local infiltration.  A 6-  French Amplatz 1 left coronary guide catheter was used and engaged the  ostium of the saphenous vein graft to the circumflex.  We gave 200 mcg  of intracoronary nitroglycerin to make guiding shots.  A 0.75 mg/kg  bolus of bivalirudin was given followed by 1.75 mg/kg per hour infusion.  ACT was documented to be greater than 300.  The patient had been placed  on Plavix oral therapy 1 week ago.   We then performed PCI using a BMW wire to cross the distal stenosis.  We  predilated with a 2.5- x 15-mm long Maverick balloon.  We then deployed  a 18-mm long x 3.0-mm PROMUS stent to 14 atmospheres.  We then  postdilated within the stent to 17 atmospheres with a 3.5- x 15-mm long  DuraStar balloon.  We then turned our attention to the mid lesion in the  saphenous vein graft.  We placed a filter wire, EX, distal and within  the previously segmented distal segment.  We deployed the filter wire  without difficulty.  After documenting appropriate position, we  attempted to advance a 23 x 3.5 PROMUS stent into the mid graft.   There  was obstruction to movement of the stent in a position that did not  completely cover the index lesion due to the device.  Not being able to  establish a more distal location with the filter wire that would allow  appropriate stent positioning, we removed the filter wire.  We recrossed  with BMW wire.  We direct stented the mid graft with a 3.5- x 23-mm long  PROMUS stent to 14 atmospheres.  Two balloon inflations were performed.  The patient tolerated the procedure without complications.  Angio-Seal  was then used for hemostasis with good results.   CONCLUSION:  Successful drug-eluting stent implantation in the saphenous  vein graft to the distal circumflex with reduction in the distal graft  stenosis from 95% to less than 10% and body graft stenosis from 60% to  0%.  TIMI grade 3 flow was noted.   PLAN:  Aspirin and Plavix indefinitely.  Plavix and aspirin combination  should be continued through any planned carotid procedure.  We discussed  this prior to stent implantation.  I have spoken with Dr. Darrick Penna about  this.      Lyn Records, M.D.  Electronically Signed     HWS/MEDQ  D:  01/14/2008  T:  01/15/2008  Job:  [id number]   cc:   Janetta Hora. Darrick Penna, MD  Georgann Housekeeper, MD

## 2011-05-07 NOTE — Procedures (Signed)
CAROTID DUPLEX EXAM   INDICATION:  Follow-up evaluation of known carotid artery disease.   HISTORY:  Diabetes:  Yes.  Cardiac:  Yes.  Hypertension:  Yes.  Smoking:  No.  Previous Surgery:  Right carotid endarterectomy in 03/2008.  CV History:  Patient reports no cerebrovascular symptoms at this time.  Previous duplex prior to endarterectomy revealed an 80-99% right ICA  stenosis and a 40-59% left ICA stenosis.  Amaurosis Fugax  No, Paresthesias  No, Hemiparesis  No.                                       RIGHT             LEFT  Brachial systolic pressure:         146               152  Brachial Doppler waveforms:         Triphasic         Triphasic  Vertebral direction of flow:        Antegrade         Inaudible  DUPLEX VELOCITIES (cm/sec)  CCA peak systolic                   156               112  ECA peak systolic                   67                89  ICA peak systolic                   58                128  ICA end diastolic                   15                45  PLAQUE MORPHOLOGY:                  None              Mixed  PLAQUE AMOUNT:                      None              Moderate  PLAQUE LOCATION:                    None              Proximal ICA   IMPRESSION:  1. Occluded left vertebral artery.  2. No right internal carotid artery stenosis, status post      endarterectomy.  3. 40-59% left internal carotid artery stenosis.      ___________________________________________  Janetta Hora Fields, MD   MC/MEDQ  D:  11/09/2008  T:  11/09/2008  Job:  [id number]

## 2011-05-07 NOTE — Procedures (Signed)
CAROTID DUPLEX EXAM   INDICATION:  Follow up known carotid disease.   HISTORY:  Diabetes:  No.  Cardiac:  CABG in 1980 and in the 90s.  Hypertension:  Yes.  Smoking:  No.  Previous Surgery:  Right carotid endarterectomy in 03/2008.  CV History:  No.  Amaurosis Fugax No, Paresthesias No, Hemiparesis No.                                       RIGHT             LEFT  Brachial systolic pressure:         180               180  Brachial Doppler waveforms:         Normal            Normal  Vertebral direction of flow:        Antegrade         Not visualized  DUPLEX VELOCITIES (cm/sec)  CCA peak systolic                   100               92  ECA peak systolic                   40                100  ICA peak systolic                   48                134  ICA end diastolic                   13                37  PLAQUE MORPHOLOGY:                                    Calcific  PLAQUE AMOUNT:                                        Mild-to-moderate  PLAQUE LOCATION:                                      ICA, ECA   IMPRESSION:  1. Right internal carotid artery appears to be patent post carotid      endarterectomy without evidence of restenosis; however, unable to      visualize distal internal carotid artery due to high bifurcation.  2. Left internal carotid artery suggests 40% to 59% stenosis.  3. Antegrade flow in right vertebral artery.  4. Left vertebral artery is not visualized.   ___________________________________________  Janetta Hora Fields, MD   NT/MEDQ  D:  11/14/2010  T:  11/14/2010  Job:  [id number]

## 2011-05-10 NOTE — Op Note (Signed)
   NAME:  [name] [name], [name] [name]                         ACCOUNT NO.:  [id number]   MEDICAL RECORD NO.:  [id number]                   PATIENT TYPE:  AMB   LOCATION:  ENDO                                 FACILITY:  [facility]   PHYSICIAN:  Danise Edge, M.D.                DATE OF BIRTH:  [birth year]   DATE OF PROCEDURE:  09/15/2003  DATE OF DISCHARGE:                                 OPERATIVE REPORT   PROCEDURE PERFORMED:  Screening colonoscopy.   ENDOSCOPIST:  Charolett Bumpers, M.D.   INDICATIONS FOR PROCEDURE:  [name] is a 77 year old male born  [birth year].  [name] is scheduled to undergo a screening colonoscopy  with polypectomy to prevent colon cancer.   PREMEDICATION:  Demerol 50 mg, Versed 7 mg.   DESCRIPTION OF PROCEDURE:  After obtaining informed consent, [name] was  placed in the left lateral decubitus position.  I administered intravenous  Demerol and intravenous Versed to achieve conscious sedation for the  procedure.  The patient's blood pressure, oxygen saturations and cardiac  rhythm were monitored throughout the procedure and documented in the medical  record.   Anal inspection was normal.  Digital rectal exam was normal.  The Olympus  adult colonoscope was introduced into the rectum and advanced to the cecum.  Colonic preparation for the exam today was excellent.   Rectum:  Normal.   Sigmoid colon and descending colon:  Normal.   Splenic flexure:  Normal.   Transverse colon:  Normal.   Hepatic flexure:  Normal.   Ascending colon:  Normal.   Cecum and ileocecal valve:  Normal.    ASSESSMENT:  Normal screening proctocolonoscopy to the cecum.  No endoscopic  evidence for the presence of colorectal neoplasia.   PLAN:                                                 Danise Edge, M.D.    MJ/MEDQ  D:  09/15/2003  T:  09/16/2003  Job:  [id number]   cc:   Georgann Housekeeper, M.D.  [address]  [place]  [place] [address]  Fax:  [phone]

## 2011-05-10 NOTE — Op Note (Signed)
NAME:  [name] [name], BRIDGE               ACCOUNT NO.:  [id number]   MEDICAL RECORD NO.:  [id number]          PATIENT TYPE:  AMB   LOCATION:  SDS                          FACILITY:  [facility]   PHYSICIAN:  Charles E. Fields, MD  DATE OF BIRTH:  20-Jul-1929   DATE OF PROCEDURE:  04/01/2006  DATE OF DISCHARGE:                                 OPERATIVE REPORT   PROCEDURE:  1.  Arch aortogram.  2.  Selective right common carotid arteriogram.   PREOPERATIVE DIAGNOSIS:  Asymptomatic right internal carotid artery  stenosis.   POSTOPERATIVE DIAGNOSIS:  Asymptomatic right internal carotid artery  stenosis.   ANESTHESIA:  Local.   INDICATIONS:  The patient has a history of a right internal carotid artery  stenosis which is 60-80% by duplex ultrasound.  A large amount of  calcification around the plaque though did not give of precise readings for  the level of stenosis.   OPERATIVE DETAIL:  Obtaining informed consent, the patient was taken to the  [facility] lab.  The patient was placed in supine position on the angio table.  Right groin was prepped and draped in the usual sterile fashion.  Local  anesthesia was infiltrated over the right common femoral artery.  Majestic  needle was used to cannulate the right common femoral artery and a 0.035  Wholey wire advanced into the abdominal aorta under fluoroscopic guidance.  Next a 5-French sheath was placed over the guidewire in the right common  femoral artery.  A 5-French pigtail catheter was then placed over the [facility]  wire and these were advanced in as a unit into the aortic arch.  Arch  aortogram was then obtained.  This shows wide patency of the origin of the  innominate artery.  There is a bovine arch configuration with the origin of  the left common carotid artery coming almost off at a right angle from the  innominate artery as its first branch.  There is approximately 50% stenosis  of the origin of this artery.   The right common carotid and  subclavian arteries comes off normal position.  The right vertebral artery is patent and large.  Proximal portions of the  right common carotid and right subclavian arteries are widely patent.  The  left subclavian artery comes off normal position.  There is a high-grade  stenosis of the left vertebral artery but this fills distally.   Next, the pigtail catheter was pulled back over the guidewire.  An H1  catheter was then placed over the guidewire and the right common carotid  artery was selectively catheterized.  A selective injection of the right  common carotid artery was then performed.  This was performed in an AP,  lateral and oblique projection.  Also, intracranial views were performed in  AP and lateral projection to be interpreted by the neuroradiologist at a  later date.   The selective right common carotid artery injection shows a 50% stenosis of  the right external carotid artery origin.  There is a 50% stenosis of the  proximal right internal carotid artery.  There is moderate calcification  of  the origin of the external carotid artery and of the stenosed segment of the  right internal carotid artery.   Next, the H1 catheter was pulled back into the aortic arch and removed over  the guidewire.  The left common carotid artery was not selectively  catheterized since there was a very steep angle which would be difficult to  cannulate as well as the fact the stenosis was only 50% near the origin of  the common carotid artery and duplex ultrasound did not show significant  stenosis of the carotid bifurcation on the left side.  A 5-French sheath was  left in place to be removed in the holding area.  The patient tolerated the  procedure well and there were no complications.  The patient was taken the  holding area in stable condition.   FINDINGS:  1.  Bovine arch configuration  2.  50% stenosis of proximal left common carotid artery.  3.  50% stenosis of right internal carotid  artery  4.  High-grade stenosis of left vertebral artery.           ______________________________  Janetta Hora Fields, MD     CEF/MEDQ  D:  04/01/2006  T:  04/01/2006  Job:  [id number]

## 2011-09-12 LAB — BASIC METABOLIC PANEL
CO2: 28
Calcium: 9
GFR calc Af Amer: >60
GFR calc non Af Amer: >60
Glucose, Bld: 64 — ABNORMAL LOW
Glucose, Bld: 77
Potassium: 3.7
Sodium: 136
Sodium: 140

## 2011-09-12 LAB — CBC
HCT: 38.8 — ABNORMAL LOW
Hemoglobin: 12.1 — ABNORMAL LOW
Hemoglobin: 13
Hemoglobin: 13
MCHC: 33.5
MCHC: 33.6
MCHC: 33.7
MCV: 89.7
Platelets: 155
RBC: 4.01 — ABNORMAL LOW
RDW: 13
RDW: 13
RDW: 13.1
WBC: 10.5
WBC: 6.4

## 2011-09-12 LAB — DIFFERENTIAL
Basophils Relative: 0
Lymphs Abs: 0.6 — ABNORMAL LOW
Monocytes Relative: 7
Neutro Abs: 10.2 — ABNORMAL HIGH
Neutrophils Relative %: 88 — ABNORMAL HIGH

## 2011-09-12 LAB — CULTURE, BLOOD (ROUTINE X 2)
Culture: NO GROWTH
Culture: NO GROWTH

## 2011-09-12 LAB — URINALYSIS, MICROSCOPIC ONLY
Specific Gravity, Urine: 1.019
Urobilinogen, UA: 1

## 2011-09-12 LAB — PROTIME-INR: INR: 1

## 2011-09-12 LAB — COMPREHENSIVE METABOLIC PANEL
BUN: 28 — ABNORMAL HIGH
Calcium: 9.6
Glucose, Bld: 178 — ABNORMAL HIGH
Total Protein: 7.2

## 2011-09-12 LAB — APTT: aPTT: 33

## 2011-09-13 LAB — TISSUE CULTURE

## 2011-09-13 LAB — DIFFERENTIAL
Basophils Absolute: 0
Basophils Absolute: 0.1
Basophils Relative: 0
Basophils Relative: 0
Basophils Relative: 1
Eosinophils Absolute: 0
Eosinophils Absolute: 0
Eosinophils Relative: 0
Eosinophils Relative: 0
Lymphocytes Relative: 19
Lymphs Abs: 1.2
Monocytes Absolute: 0.5
Monocytes Relative: 6
Neutro Abs: 4
Neutro Abs: 7.5
Neutrophils Relative %: 72
Neutrophils Relative %: 80 — ABNORMAL HIGH
Neutrophils Relative %: 80 — ABNORMAL HIGH

## 2011-09-13 LAB — BASIC METABOLIC PANEL
BUN: 18
BUN: 25 — ABNORMAL HIGH
CO2: 26
CO2: 28
CO2: 28
CO2: 29
Calcium: 8.8
Calcium: 9
Calcium: 9.1
Calcium: 9.2
Chloride: 102
Chloride: 105
Creatinine, Ser: 0.75
Creatinine, Ser: 0.78
Creatinine, Ser: 0.82
Creatinine, Ser: 0.87
Creatinine, Ser: 1.02
GFR calc Af Amer: >60
GFR calc Af Amer: >60
GFR calc Af Amer: >60
GFR calc Af Amer: >60
GFR calc non Af Amer: >60
GFR calc non Af Amer: >60
GFR calc non Af Amer: >60
GFR calc non Af Amer: >60
Glucose, Bld: 143 — ABNORMAL HIGH
Glucose, Bld: 97
Potassium: 3.9
Potassium: 4
Sodium: 137
Sodium: 137
Sodium: 138

## 2011-09-13 LAB — CBC
HCT: 28.2 — ABNORMAL LOW
HCT: 31.6 — ABNORMAL LOW
Hemoglobin: 9.5 — ABNORMAL LOW
MCHC: 33.2
MCHC: 33.3
MCHC: 33.7
MCHC: 33.9
MCHC: 34
MCV: 89.6
MCV: 89.7
MCV: 89.8
Platelets: 174
Platelets: 214
RBC: 3.18 — ABNORMAL LOW
RBC: 3.43 — ABNORMAL LOW
RBC: 3.59 — ABNORMAL LOW
RBC: 3.72 — ABNORMAL LOW
RDW: 13.4
RDW: 13.7
WBC: 5.1
WBC: 6.4

## 2011-09-13 LAB — CARBOXYHEMOGLOBIN
Carboxyhemoglobin: 1
O2 Saturation: 54.8
Total hemoglobin: 10.4 — ABNORMAL LOW

## 2011-09-13 LAB — ANAEROBIC CULTURE

## 2011-09-13 LAB — VANCOMYCIN, TROUGH
Vancomycin Tr: 15.3
Vancomycin Tr: 8.2

## 2011-09-17 LAB — URINALYSIS, ROUTINE W REFLEX MICROSCOPIC
Bilirubin Urine: NEGATIVE
Ketones, ur: NEGATIVE
Nitrite: NEGATIVE
Protein, ur: NEGATIVE
Specific Gravity, Urine: 1.023
Urobilinogen, UA: 0.2

## 2011-09-17 LAB — CBC
HCT: 28.7 — ABNORMAL LOW
HCT: 36.8 — ABNORMAL LOW
MCV: 85.9
Platelets: 104 — ABNORMAL LOW
Platelets: 126 — ABNORMAL LOW
RDW: 14.7
WBC: 4.8
WBC: 7.8

## 2011-09-17 LAB — COMPREHENSIVE METABOLIC PANEL
Albumin: 3.8
BUN: 16
Chloride: 105
Creatinine, Ser: 0.87
Total Bilirubin: 0.7

## 2011-09-17 LAB — URINE MICROSCOPIC-ADD ON

## 2011-09-17 LAB — BASIC METABOLIC PANEL
BUN: 18
Calcium: 8.4
GFR calc non Af Amer: >60
Potassium: 4.2
Sodium: 134 — ABNORMAL LOW

## 2011-09-17 LAB — TYPE AND SCREEN: Antibody Screen: NEGATIVE

## 2011-09-17 LAB — ABO/RH: ABO/RH(D): O POS

## 2011-09-17 LAB — PROTIME-INR
INR: 1
Prothrombin Time: 13.1

## 2011-09-17 LAB — APTT: aPTT: 31

## 2011-10-04 ENCOUNTER — Encounter: Payer: Self-pay | Admitting: Vascular Surgery

## 2011-11-15 ENCOUNTER — Ambulatory Visit: Payer: Self-pay

## 2011-11-15 ENCOUNTER — Other Ambulatory Visit: Payer: Self-pay

## 2011-11-21 ENCOUNTER — Other Ambulatory Visit: Payer: Self-pay

## 2011-11-21 ENCOUNTER — Ambulatory Visit: Payer: Self-pay

## 2011-11-28 ENCOUNTER — Encounter: Payer: Self-pay | Admitting: Physician Assistant

## 2011-11-29 ENCOUNTER — Ambulatory Visit (INDEPENDENT_AMBULATORY_CARE_PROVIDER_SITE_OTHER): Payer: PRIVATE HEALTH INSURANCE | Admitting: *Deleted

## 2011-11-29 ENCOUNTER — Encounter: Payer: Self-pay | Admitting: Physician Assistant

## 2011-11-29 ENCOUNTER — Ambulatory Visit (INDEPENDENT_AMBULATORY_CARE_PROVIDER_SITE_OTHER): Payer: PRIVATE HEALTH INSURANCE | Admitting: Physician Assistant

## 2011-11-29 VITALS — BP 140/55 | HR 76 | Resp 20 | Ht 66.0 in | Wt 180.0 lb

## 2011-11-29 DIAGNOSIS — I6529 Occlusion and stenosis of unspecified carotid artery: Secondary | ICD-10-CM

## 2011-11-29 DIAGNOSIS — Z48812 Encounter for surgical aftercare following surgery on the circulatory system: Secondary | ICD-10-CM

## 2011-11-29 NOTE — Progress Notes (Signed)
[facility] [place] HISTORY AND PHYSICAL   CC:  Follow up carotid duplex scan  Referring Provider:  Daiva Eves, Remi Haggard, MD  HPI: This is a 77 y.o. male here for f/u carotid duplex scan. He is s/p right CEA 04/18/08.  He denies any symptoms of stroke such as amaurosis fugax, slurred speech, paresthesias, or hemiparesis.  He states that he has had some vision changes in the left eye, but his daughter states that he has what sounds like a vitreous leak and lens implant in that eye.  He states that he has some pain in his legs at his knees, but denies any claudication symptoms.  Past Medical History  Diagnosis Date  . Diabetes mellitus   . Myocardial infarction   . Carotid artery occlusion   . Hyperlipidemia   . Hypertension   . Anxiety   . CAD (coronary artery disease)   . Chronic insomnia   . Dementia   . Arthritis   . Cataracts, bilateral    Past Surgical History  Procedure Date  . Carotid endarterectomy 03/2008  . Coronary artery bypass graft 1980 & 1990s  . Knee arthroscopy     left  . Cataract extraction     bilateral    No Known Allergies  Current Outpatient Prescriptions  Medication Sig Dispense Refill  . furosemide (LASIX) 40 MG tablet Take 40 mg by mouth daily.        Marland Kitchen glipiZIDE (GLUCOTROL) 5 MG tablet Take 5 mg by mouth 2 (two) times daily before a meal.        . insulin glargine (LANTUS) 100 UNIT/ML injection Inject 20 Units into the skin at bedtime.        Marland Kitchen lisinopril (PRINIVIL,ZESTRIL) 20 MG tablet Take 20 mg by mouth daily.        Marland Kitchen LORazepam (ATIVAN) 2 MG tablet Take 2 mg by mouth every 6 (six) hours as needed.        . metFORMIN (GLUCOPHAGE) 500 MG tablet Take 500 mg by mouth 2 (two) times daily with a meal.        . metoprolol (LOPRESSOR) 50 MG tablet Take 25 mg by mouth 2 (two) times daily.        . nitroGLYCERIN (NITROSTAT) 0.4 MG SL tablet Place 0.4 mg under the tongue every 5 (five) minutes as needed.        . potassium chloride SA  (K-DUR,KLOR-CON) 20 MEQ tablet Take 20 mEq by mouth daily.        . rosiglitazone (AVANDIA) 8 MG tablet Take 8 mg by mouth daily.        . rosuvastatin (CRESTOR) 10 MG tablet Take 10 mg by mouth daily.          Family History  Problem Relation Age of Onset  . Heart disease Father     History   Social History  . Marital Status: Widowed    Spouse Name: N/A    Number of Children: N/A  . Years of Education: N/A   Occupational History  . Not on file.   Social History Main Topics  . Smoking status: Former Smoker -- 25 years    Types: Cigarettes    Quit date: 12/23/1978  . Smokeless tobacco: Never Used  . Alcohol Use: Yes  . Drug Use: No  . Sexually Active: Not on file   Other Topics Concern  . Not on file   Social History Narrative  . No narrative on file  ROS: [x]  Positive   [ ]  Negative   [ ]  All sytems reviewed and are negative See HPI. Cardiovascular: [] chest pain; [] chest pressure; [] palpitations; [] SOB lying flat; [] DOE; [x] pain in legs with walking; [] pain in legs when lying flat; [] Hx of DVT; [] Hx phlebitis; [x] swelling in legs; [] varicose veins Pulmonary: [] productive cough; [] asthma; [] wheezing Neurologic: [] Hx CVA; [] weakness in arms or legs; [] numbness in arms or legs; [] difficulty in speaking or slurred speech; [x] temporary loss of vision in one eye; [] dizziness Hematologic:  [] bleeding problems; [] clots easily GI:  [] vomiting blood; []  blood in stool; [] PUD GU: []  Dysuria; [] hematuria Psychiatric:  [x] Hx major depression Integumentary:  [] rashes; [] ulcers Constitutional:  [] fever; [] chills   PHYSICAL EXAMINATION:  Filed Vitals:   11/29/11 1622  BP: 140/55  Pulse: 76  Resp: 20   Body mass index is 29.05 kg/(m^2).  General:  WDWN in NAD Gait: Normal HENT: WNL Eyes: PERRL Pulmonary: normal non-labored breathing , without Rales, rhonchi,  wheezing Cardiac: RRR, without  Murmurs, rubs or gallops; No carotid bruits Abdomen: soft, NT, no  masses Skin: no rashes, ulcers noted Vascular Exam/Pulses: 2+ radial pulses bilaterally; Extremities: without ischemic changes, no Gangrene , no cellulitis; no open wounds; no edema BLE  Musculoskeletal: no muscle wasting or atrophy  Neurologic: A&O X 3; Appropriate Affect ; SENSATION: normal; MOTOR FUNCTION:  moving all extremities equally. Speech is fluent/normal  Non-Invasive Vascular Imaging: Carotid Duplex Scan:   Right  left          Duplex velocities (cm/sec)    CCA peak systolic 80 78  ECA peak systolic 44 76  ICA peak systolic 60 102  ICA end diastolic 12 28  Plaque morphology  hetero  Plaque amount none mild  Plaque location  ICA/ECA      -patent right CEA site without RICA stenosis -no hemodynamically significant stenosis of the LICA with plaque as described above  ASSESSMENT: 77 y.o. male here for f/u carotid duplex scan. He is doing quite well and has not had any symptoms of CVA.  His carotid duplex is basically unchanged.  PLAN: return in one year for f/u carotid duplex scan.  He and his daughter know to call before then if he has any issues or problems.  Newton Pigg, PA-C [facility] [phone]  Clinic MD:   Imogene Burn

## 2012-01-16 NOTE — Procedures (Unsigned)
CAROTID DUPLEX EXAM  INDICATION:  Right carotid endarterectomy.  HISTORY: Diabetes:  No. Cardiac:  CABG. Hypertension:  Yes. Smoking:  No. Previous Surgery:  Right carotid endarterectomy in April 2009. CV History:  Currently asymptomatic. Amaurosis Fugax No, Paresthesias No, Hemiparesis No.                                      RIGHT             LEFT Brachial systolic pressure: Brachial Doppler waveforms: Vertebral direction of flow:        Antegrade         Not visualized DUPLEX VELOCITIES (cm/sec) CCA peak systolic                   80                78 ECA peak systolic                   44                76 ICA peak systolic                   60                102 ICA end diastolic                   12                28 PLAQUE MORPHOLOGY:                                    Heterogenous PLAQUE AMOUNT:                      None              Mild PLAQUE LOCATION:                                      ICA/ECA  IMPRESSION: 1. Patent right carotid endarterectomy site with no right internal     carotid artery stenosis. 2. No hemodynamically significant stenosis of the left internal     carotid artery with plaque formations as described above. 3. Doppler velocities of the left internal carotid artery appear less     than previously recorded when compared to the previous examination     on 11/14/2010 with the right internal carotid artery remaining     stable.  ___________________________________________ Janetta Hora. Fields, MD  CH/MEDQ  D:  12/03/2011  T:  12/03/2011  Job:  [id number]

## 2012-11-27 ENCOUNTER — Encounter: Payer: Self-pay | Admitting: Neurosurgery

## 2012-11-27 ENCOUNTER — Other Ambulatory Visit: Payer: Self-pay | Admitting: *Deleted

## 2012-11-27 DIAGNOSIS — I6529 Occlusion and stenosis of unspecified carotid artery: Secondary | ICD-10-CM

## 2012-11-27 DIAGNOSIS — Z48812 Encounter for surgical aftercare following surgery on the circulatory system: Secondary | ICD-10-CM

## 2012-11-30 ENCOUNTER — Other Ambulatory Visit: Payer: PRIVATE HEALTH INSURANCE

## 2012-11-30 ENCOUNTER — Ambulatory Visit: Payer: PRIVATE HEALTH INSURANCE | Admitting: Neurosurgery

## 2012-12-02 ENCOUNTER — Encounter: Payer: Self-pay | Admitting: Neurosurgery

## 2012-12-02 ENCOUNTER — Other Ambulatory Visit: Payer: Self-pay | Admitting: *Deleted

## 2012-12-02 DIAGNOSIS — Z48812 Encounter for surgical aftercare following surgery on the circulatory system: Secondary | ICD-10-CM

## 2012-12-02 DIAGNOSIS — I6529 Occlusion and stenosis of unspecified carotid artery: Secondary | ICD-10-CM

## 2012-12-03 ENCOUNTER — Encounter: Payer: Self-pay | Admitting: Neurosurgery

## 2012-12-03 ENCOUNTER — Other Ambulatory Visit (INDEPENDENT_AMBULATORY_CARE_PROVIDER_SITE_OTHER): Payer: PRIVATE HEALTH INSURANCE | Admitting: *Deleted

## 2012-12-03 ENCOUNTER — Ambulatory Visit (INDEPENDENT_AMBULATORY_CARE_PROVIDER_SITE_OTHER): Payer: PRIVATE HEALTH INSURANCE | Admitting: Neurosurgery

## 2012-12-03 VITALS — BP 162/71 | HR 59 | Resp 16 | Ht 66.0 in | Wt 173.9 lb

## 2012-12-03 DIAGNOSIS — Z48812 Encounter for surgical aftercare following surgery on the circulatory system: Secondary | ICD-10-CM

## 2012-12-03 DIAGNOSIS — I6529 Occlusion and stenosis of unspecified carotid artery: Secondary | ICD-10-CM

## 2012-12-03 NOTE — Progress Notes (Signed)
[facility] [place] Carotid Office Note  CC: Carotid surveillance Referring Physician: Fields  History of Present Illness:  78 year old male patient of Dr. Darrick Penna status post right CEA in 2009. The patient denies any signs or symptoms of CVA, TIA, amaurosis fugax or any neural deficit. The patient denies any new medical diagnoses or recent surgery.  Past Medical History  Diagnosis Date  . Diabetes mellitus   . Myocardial infarction   . Carotid artery occlusion   . Hyperlipidemia   . Hypertension   . Anxiety   . CAD (coronary artery disease)   . Chronic insomnia   . Dementia   . Arthritis   . Cataracts, bilateral     ROS: [x]  Positive   [ ]  Denies    General: [ ]  Weight loss, [ ]  Fever, [ ]  chills Neurologic: [ ]  Dizziness, [ ]  Blackouts, [ ]  Seizure [ ]  Stroke, [ ]  "Mini stroke", [ ]  Slurred speech, [x ] Temporary blindness; [ x] weakness in arms or legs, [ ]  Hoarseness Cardiac: [ ]  Chest pain/pressure, [ ]  Shortness of breath at rest [ ]  Shortness of breath with exertion, [ ]  Atrial fibrillation or irregular heartbeat Vascular: [x ] Pain in legs with walking-(knees), [ ]  Pain in legs at rest, [ ]  Pain in legs at night,  [ ]  Non-healing ulcer, [ ]  Blood clot in vein/DVT,   Pulmonary: [ ]  Home oxygen, [ ]  Productive cough, [ ]  Coughing up blood, [ ]  Asthma,  [ ]  Wheezing Musculoskeletal:  [ ]  Arthritis, [ ]  Low back pain, [ ]  Joint pain Hematologic: [ ]  Easy Bruising, [ ]  Anemia; [ ]  Hepatitis Gastrointestinal: [ ]  Blood in stool, [ ]  Gastroesophageal Reflux/heartburn, [ ]  Trouble swallowing Urinary: [ ]  chronic Kidney disease, [ ]  on HD - [ ]  MWF or [ ]  TTHS, [ ]  Burning with urination, [ ]  Difficulty urinating Skin: [ ]  Rashes, [ ]  Wounds Psychological: [ ]  Anxiety, [ ]  Depression   Social History History  Substance Use Topics  . Smoking status: Former Smoker -- 25 years    Types: Cigarettes    Quit date: 12/23/1978  . Smokeless tobacco: Never  Used  . Alcohol Use: Yes    Family History Family History  Problem Relation Age of Onset  . Heart disease Father     No Known Allergies  Current Outpatient Prescriptions  Medication Sig Dispense Refill  . diclofenac sodium (VOLTAREN) 1 % GEL Apply topically.        Marland Kitchen FORA V12 BLOOD GLUCOSE TEST test strip       . furosemide (LASIX) 40 MG tablet Take 40 mg by mouth daily.        Marland Kitchen glipiZIDE (GLUCOTROL) 5 MG tablet Take 5 mg by mouth 2 (two) times daily before a meal.        . hydrochlorothiazide (MICROZIDE) 12.5 MG capsule       . insulin glargine (LANTUS) 100 UNIT/ML injection Inject 20 Units into the skin at bedtime.        Marland Kitchen lisinopril (PRINIVIL,ZESTRIL) 20 MG tablet Take 20 mg by mouth daily.        . Lite Touch Lancets MISC       . LORazepam (ATIVAN) 2 MG tablet Take 2 mg by mouth every 6 (six) hours as needed.        . metFORMIN (GLUCOPHAGE) 500 MG tablet Take 500 mg by mouth 2 (two) times daily with a meal.        .  metoprolol (LOPRESSOR) 50 MG tablet Take 25 mg by mouth 2 (two) times daily.        . nitroGLYCERIN (NITROSTAT) 0.4 MG SL tablet Place 0.4 mg under the tongue every 5 (five) minutes as needed.        Bertram Gala Glycol-Propyl Glycol (SYSTANE) 0.4-0.3 % SOLN Apply to eye daily.        . potassium chloride SA (K-DUR,KLOR-CON) 20 MEQ tablet Take 20 mEq by mouth daily.        Marland Kitchen RELION INSULIN SYR 0.5ML/31G 31G X 5/16" 0.5 ML MISC 20 mg At bedtime as needed.      . rosiglitazone (AVANDIA) 8 MG tablet Take 8 mg by mouth daily.        . rosuvastatin (CRESTOR) 10 MG tablet Take 10 mg by mouth daily.          Physical Examination  Filed Vitals:   12/03/12 1017  BP: 162/71  Pulse:   Resp:     Body mass index is 28.07 kg/(m^2).  General:  WDWN in NAD Gait: Normal HEENT: WNL Eyes: Pupils equal Pulmonary: normal non-labored breathing , without Rales, rhonchi,  wheezing Cardiac: RRR, without  Murmurs, rubs or gallops; Abdomen: soft, NT, no masses Skin: no  rashes, ulcers noted  Vascular Exam Pulses: 3+ radial pulses bilaterally Carotid bruits: Carotid pulses to auscultation no bruits are heard Extremities without ischemic changes, no Gangrene , no cellulitis; no open wounds;  Musculoskeletal: no muscle wasting or atrophy   Neurologic: A&O X 3; Appropriate Affect ; SENSATION: normal; MOTOR FUNCTION:  moving all extremities equally. Speech is fluent/normal  Non-Invasive Vascular Imaging CAROTID DUPLEX 12/03/2012  Right ICA 0 - 19% stenosis Left ICA 20 - 39 % stenosis   ASSESSMENT/PLAN: Asymptomatic patient with mild left carotid stenosis. The patient will followup in one year with repeat carotid duplex. The patient's questions were encouraged and answered, he is in agreement with this plan.  Lauree Chandler ANP   Clinic MD: Darrick Penna

## 2013-01-11 ENCOUNTER — Other Ambulatory Visit: Payer: Self-pay | Admitting: *Deleted

## 2013-01-11 DIAGNOSIS — I6529 Occlusion and stenosis of unspecified carotid artery: Secondary | ICD-10-CM

## 2013-12-08 ENCOUNTER — Encounter: Payer: Self-pay | Admitting: Family

## 2013-12-09 ENCOUNTER — Ambulatory Visit: Payer: PRIVATE HEALTH INSURANCE | Admitting: Family

## 2013-12-09 ENCOUNTER — Inpatient Hospital Stay (HOSPITAL_COMMUNITY): Admission: RE | Admit: 2013-12-09 | Payer: PRIVATE HEALTH INSURANCE | Source: Ambulatory Visit

## 2013-12-09 ENCOUNTER — Other Ambulatory Visit (HOSPITAL_COMMUNITY): Payer: PRIVATE HEALTH INSURANCE

## 2014-02-16 ENCOUNTER — Emergency Department (HOSPITAL_COMMUNITY)
Admission: EM | Admit: 2014-02-16 | Discharge: 2014-02-17 | Disposition: A | Discharge status: Home / Self Care | Payer: Medicare HMO | Attending: Emergency Medicine | Admitting: Emergency Medicine

## 2014-02-16 ENCOUNTER — Encounter (HOSPITAL_COMMUNITY): Payer: Self-pay | Admitting: Emergency Medicine

## 2014-02-16 DIAGNOSIS — I252 Old myocardial infarction: Secondary | ICD-10-CM | POA: Insufficient documentation

## 2014-02-16 DIAGNOSIS — Z794 Long term (current) use of insulin: Secondary | ICD-10-CM | POA: Insufficient documentation

## 2014-02-16 DIAGNOSIS — E785 Hyperlipidemia, unspecified: Secondary | ICD-10-CM | POA: Insufficient documentation

## 2014-02-16 DIAGNOSIS — F411 Generalized anxiety disorder: Secondary | ICD-10-CM | POA: Insufficient documentation

## 2014-02-16 DIAGNOSIS — E119 Type 2 diabetes mellitus without complications: Secondary | ICD-10-CM | POA: Insufficient documentation

## 2014-02-16 DIAGNOSIS — R739 Hyperglycemia, unspecified: Secondary | ICD-10-CM

## 2014-02-16 DIAGNOSIS — Z87891 Personal history of nicotine dependence: Secondary | ICD-10-CM | POA: Insufficient documentation

## 2014-02-16 DIAGNOSIS — I2510 Atherosclerotic heart disease of native coronary artery without angina pectoris: Secondary | ICD-10-CM | POA: Insufficient documentation

## 2014-02-16 DIAGNOSIS — F0390 Unspecified dementia without behavioral disturbance: Secondary | ICD-10-CM | POA: Insufficient documentation

## 2014-02-16 DIAGNOSIS — Y9389 Activity, other specified: Secondary | ICD-10-CM | POA: Insufficient documentation

## 2014-02-16 DIAGNOSIS — Z8739 Personal history of other diseases of the musculoskeletal system and connective tissue: Secondary | ICD-10-CM | POA: Insufficient documentation

## 2014-02-16 DIAGNOSIS — Z043 Encounter for examination and observation following other accident: Secondary | ICD-10-CM | POA: Insufficient documentation

## 2014-02-16 DIAGNOSIS — I10 Essential (primary) hypertension: Secondary | ICD-10-CM | POA: Insufficient documentation

## 2014-02-16 DIAGNOSIS — Z79899 Other long term (current) drug therapy: Secondary | ICD-10-CM | POA: Insufficient documentation

## 2014-02-16 DIAGNOSIS — Y92410 Unspecified street and highway as the place of occurrence of the external cause: Secondary | ICD-10-CM | POA: Insufficient documentation

## 2014-02-16 DIAGNOSIS — G4700 Insomnia, unspecified: Secondary | ICD-10-CM | POA: Insufficient documentation

## 2014-02-16 DIAGNOSIS — E871 Hypo-osmolality and hyponatremia: Secondary | ICD-10-CM | POA: Insufficient documentation

## 2014-02-16 DIAGNOSIS — Z951 Presence of aortocoronary bypass graft: Secondary | ICD-10-CM | POA: Insufficient documentation

## 2014-02-16 LAB — I-STAT CHEM 8, ED
BUN: 22 mg/dL (ref 6–23)
CREATININE: 1 mg/dL (ref 0.50–1.35)
Calcium, Ion: 1.18 mmol/L (ref 1.13–1.30)
Chloride: 92 mEq/L — ABNORMAL LOW (ref 96–112)
GLUCOSE: 507 mg/dL — AB (ref 70–99)
HCT: 44 % (ref 39.0–52.0)
HEMOGLOBIN: 15 g/dL (ref 13.0–17.0)
Potassium: 4.4 mEq/L (ref 3.7–5.3)
SODIUM: 127 meq/L — AB (ref 137–147)
TCO2: 28 mmol/L (ref 0–100)

## 2014-02-16 LAB — CBG MONITORING, ED: Glucose-Capillary: 512 mg/dL — ABNORMAL HIGH (ref 70–99)

## 2014-02-16 MED ORDER — INSULIN ASPART 100 UNIT/ML ~~LOC~~ SOLN
10.0000 [IU] | Freq: Once | SUBCUTANEOUS | Status: AC
  Administered 2014-02-17: 10 [IU] via INTRAVENOUS
  Filled 2014-02-16: qty 1

## 2014-02-16 MED ORDER — SODIUM CHLORIDE 0.9 % IV BOLUS (SEPSIS)
1000.0000 mL | Freq: Once | INTRAVENOUS | Status: AC
  Administered 2014-02-16: 1000 mL via INTRAVENOUS
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
## 2014-02-16 NOTE — ED Notes (Signed)
MD at bedside.Dr Zavitz 

## 2014-02-17 ENCOUNTER — Emergency Department (HOSPITAL_COMMUNITY): Payer: Medicare HMO

## 2014-02-17 LAB — URINALYSIS, ROUTINE W REFLEX MICROSCOPIC
BILIRUBIN URINE: NEGATIVE
KETONES UR: 15 mg/dL — AB
Leukocytes, UA: NEGATIVE
Nitrite: NEGATIVE
PH: 6.5 (ref 5.0–8.0)
Protein, ur: NEGATIVE mg/dL
Specific Gravity, Urine: 1.028 (ref 1.005–1.030)
Urobilinogen, UA: 0.2 mg/dL (ref 0.0–1.0)

## 2014-02-17 LAB — CBG MONITORING, ED: Glucose-Capillary: 376 mg/dL — ABNORMAL HIGH (ref 70–99)

## 2014-02-17 LAB — URINE MICROSCOPIC-ADD ON

## 2014-02-17 NOTE — ED Notes (Signed)
Was reported to this nurse that patient has been discharge and will stay the night d/t weather conditions, approved by charge nurse karen rm
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
## 2014-02-17 NOTE — ED Notes (Signed)
Patient transported to X-ray 

## 2014-02-17 NOTE — ED Notes (Signed)
CBG = 376 

## 2014-02-17 NOTE — ED Notes (Signed)
Dr. Jodi MourningZavitz allows for patient to have drink. Reported CBG of 376.

## 2014-02-17 NOTE — Discharge Instructions (Signed)
Discuss medication adjustment for diabetes. Check glucose regularly. If you were given medicines take as directed.  If you are on coumadin or contraceptives realize their levels and effectiveness is altered by many different medicines.  If you have any reaction (rash, tongues swelling, other) to the medicines stop taking and see a physician.   Please follow up as directed and return to the ER or see a physician for new or worsening symptoms.  Thank you.  High Blood Sugar High blood sugar (hyperglycemia) means that the level of sugar in your blood is higher than it should be. Signs of high blood sugar include:  Feeling thirsty.  Frequent peeing (urinating).  Feeling tired or sleepy.  Dry mouth.  Vision changes.  Feeling weak.  Feeling hungry but losing weight.  Numbness and tingling in your hands or feet.  Headache. When you ignore these signs, your blood sugar may keep going up. These problems may get worse, and other problems may begin. HOME CARE  Check your blood sugars as told by your doctor. Write down the numbers with the date and time.  Take the right amount of insulin or diabetes pills at the right time. Write down the dose with date and time.  Refill your insulin or diabetes pills before running out.  Watch what you eat. Follow your meal plan.  Drink liquids without sugar, such as water. Check with your doctor if you have kidney or heart disease.  Follow your doctor's orders for exercise. Exercise at the same time of day.  Keep your doctor's appointments. GET HELP RIGHT AWAY IF:   You have trouble thinking or are confused.  You have fast breathing with fruity smelling breath.  You pass out (faint).  You have 2 to 3 days of high blood sugars and you do not know why.  You have chest pain.  You are feeling sick to your stomach (nauseous) or throwing up (vomiting).  You have sudden vision changes. MAKE SURE YOU:   Understand these instructions.  Will  watch your condition.  Will get help right away if you are not doing well or get worse. Document Released: 10/06/2009 Document Revised: 03/02/2012 Document Reviewed: 10/06/2009 [facility] LP[facility] Patient Information 2014 [place], [place].
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
## 2014-02-17 NOTE — ED Notes (Signed)
Pt given orange juice and graham crackers, pt stating "I just can't sleep, can I have a snack?"

## 2014-02-17 NOTE — ED Notes (Signed)
Patient back to room.

## 2014-02-17 NOTE — ED Notes (Signed)
Spoke with Press photographercharge nurse. She will get pt a cab voucher to go home . Pt is agreeable

## 2014-03-03 ENCOUNTER — Other Ambulatory Visit: Payer: Self-pay

## 2014-03-03 ENCOUNTER — Encounter (HOSPITAL_COMMUNITY): Payer: Self-pay | Admitting: Adult Health

## 2014-03-03 ENCOUNTER — Inpatient Hospital Stay (HOSPITAL_COMMUNITY): Payer: Medicare HMO

## 2014-03-03 ENCOUNTER — Inpatient Hospital Stay (HOSPITAL_COMMUNITY)
Admission: EM | Admit: 2014-03-03 | Discharge: 2014-03-15 | DRG: 871 | Disposition: A | Discharge status: Skilled Nursing Facility | Payer: Medicare HMO | Attending: Internal Medicine | Admitting: Internal Medicine

## 2014-03-03 ENCOUNTER — Emergency Department (HOSPITAL_COMMUNITY): Payer: Medicare HMO

## 2014-03-03 DIAGNOSIS — M47812 Spondylosis without myelopathy or radiculopathy, cervical region: Secondary | ICD-10-CM | POA: Diagnosis present

## 2014-03-03 DIAGNOSIS — Z66 Do not resuscitate: Secondary | ICD-10-CM | POA: Diagnosis present

## 2014-03-03 DIAGNOSIS — Z23 Encounter for immunization: Secondary | ICD-10-CM

## 2014-03-03 DIAGNOSIS — I252 Old myocardial infarction: Secondary | ICD-10-CM

## 2014-03-03 DIAGNOSIS — I959 Hypotension, unspecified: Secondary | ICD-10-CM | POA: Diagnosis present

## 2014-03-03 DIAGNOSIS — I2582 Chronic total occlusion of coronary artery: Secondary | ICD-10-CM | POA: Diagnosis present

## 2014-03-03 DIAGNOSIS — E1110 Type 2 diabetes mellitus with ketoacidosis without coma: Secondary | ICD-10-CM

## 2014-03-03 DIAGNOSIS — R404 Transient alteration of awareness: Secondary | ICD-10-CM

## 2014-03-03 DIAGNOSIS — R6520 Severe sepsis without septic shock: Secondary | ICD-10-CM

## 2014-03-03 DIAGNOSIS — Z515 Encounter for palliative care: Secondary | ICD-10-CM

## 2014-03-03 DIAGNOSIS — Z7982 Long term (current) use of aspirin: Secondary | ICD-10-CM

## 2014-03-03 DIAGNOSIS — R578 Other shock: Secondary | ICD-10-CM | POA: Diagnosis present

## 2014-03-03 DIAGNOSIS — I214 Non-ST elevation (NSTEMI) myocardial infarction: Secondary | ICD-10-CM

## 2014-03-03 DIAGNOSIS — R579 Shock, unspecified: Secondary | ICD-10-CM

## 2014-03-03 DIAGNOSIS — R4182 Altered mental status, unspecified: Secondary | ICD-10-CM

## 2014-03-03 DIAGNOSIS — D696 Thrombocytopenia, unspecified: Secondary | ICD-10-CM | POA: Diagnosis present

## 2014-03-03 DIAGNOSIS — Z87891 Personal history of nicotine dependence: Secondary | ICD-10-CM

## 2014-03-03 DIAGNOSIS — G9341 Metabolic encephalopathy: Secondary | ICD-10-CM | POA: Diagnosis present

## 2014-03-03 DIAGNOSIS — F411 Generalized anxiety disorder: Secondary | ICD-10-CM | POA: Diagnosis present

## 2014-03-03 DIAGNOSIS — E872 Acidosis, unspecified: Secondary | ICD-10-CM

## 2014-03-03 DIAGNOSIS — A0472 Enterocolitis due to Clostridium difficile, not specified as recurrent: Secondary | ICD-10-CM

## 2014-03-03 DIAGNOSIS — R7989 Other specified abnormal findings of blood chemistry: Secondary | ICD-10-CM

## 2014-03-03 DIAGNOSIS — F0390 Unspecified dementia without behavioral disturbance: Secondary | ICD-10-CM | POA: Diagnosis present

## 2014-03-03 DIAGNOSIS — G9340 Encephalopathy, unspecified: Secondary | ICD-10-CM

## 2014-03-03 DIAGNOSIS — R6521 Severe sepsis with septic shock: Secondary | ICD-10-CM

## 2014-03-03 DIAGNOSIS — E870 Hyperosmolality and hypernatremia: Secondary | ICD-10-CM | POA: Diagnosis present

## 2014-03-03 DIAGNOSIS — I2510 Atherosclerotic heart disease of native coronary artery without angina pectoris: Secondary | ICD-10-CM

## 2014-03-03 DIAGNOSIS — R680 Hypothermia, not associated with low environmental temperature: Secondary | ICD-10-CM | POA: Diagnosis present

## 2014-03-03 DIAGNOSIS — E876 Hypokalemia: Secondary | ICD-10-CM | POA: Diagnosis present

## 2014-03-03 DIAGNOSIS — E785 Hyperlipidemia, unspecified: Secondary | ICD-10-CM | POA: Diagnosis present

## 2014-03-03 DIAGNOSIS — Z79899 Other long term (current) drug therapy: Secondary | ICD-10-CM

## 2014-03-03 DIAGNOSIS — A419 Sepsis, unspecified organism: Principal | ICD-10-CM

## 2014-03-03 DIAGNOSIS — Z951 Presence of aortocoronary bypass graft: Secondary | ICD-10-CM

## 2014-03-03 DIAGNOSIS — N179 Acute kidney failure, unspecified: Secondary | ICD-10-CM

## 2014-03-03 DIAGNOSIS — R531 Weakness: Secondary | ICD-10-CM

## 2014-03-03 DIAGNOSIS — E43 Unspecified severe protein-calorie malnutrition: Secondary | ICD-10-CM

## 2014-03-03 DIAGNOSIS — E1010 Type 1 diabetes mellitus with ketoacidosis without coma: Secondary | ICD-10-CM | POA: Diagnosis present

## 2014-03-03 DIAGNOSIS — Z6827 Body mass index (BMI) 27.0-27.9, adult: Secondary | ICD-10-CM

## 2014-03-03 DIAGNOSIS — E860 Dehydration: Secondary | ICD-10-CM

## 2014-03-03 DIAGNOSIS — I10 Essential (primary) hypertension: Secondary | ICD-10-CM | POA: Diagnosis present

## 2014-03-03 DIAGNOSIS — J9600 Acute respiratory failure, unspecified whether with hypoxia or hypercapnia: Secondary | ICD-10-CM

## 2014-03-03 DIAGNOSIS — T68XXXA Hypothermia, initial encounter: Secondary | ICD-10-CM

## 2014-03-03 HISTORY — DX: Acute myocardial infarction, unspecified: I21.9

## 2014-03-03 HISTORY — DX: Essential (primary) hypertension: I10

## 2014-03-03 HISTORY — DX: Type 2 diabetes mellitus without complications: E11.9

## 2014-03-03 LAB — CBC WITH DIFFERENTIAL/PLATELET
BASOS ABS: 0 10*3/uL (ref 0.0–0.1)
BASOS PCT: 0 % (ref 0–1)
EOS PCT: 0 % (ref 0–5)
Eosinophils Absolute: 0 10*3/uL (ref 0.0–0.7)
HCT: 47.7 % (ref 39.0–52.0)
Hemoglobin: 15.2 g/dL (ref 13.0–17.0)
LYMPHS PCT: 8 % — AB (ref 12–46)
Lymphs Abs: 0.5 10*3/uL — ABNORMAL LOW (ref 0.7–4.0)
MCH: 30.3 pg (ref 26.0–34.0)
MCHC: 31.9 g/dL (ref 30.0–36.0)
MCV: 95 fL (ref 78.0–100.0)
Monocytes Absolute: 0.7 10*3/uL (ref 0.1–1.0)
Monocytes Relative: 11 % (ref 3–12)
NEUTROS ABS: 5.2 10*3/uL (ref 1.7–7.7)
Neutrophils Relative %: 81 % — ABNORMAL HIGH (ref 43–77)
Platelets: 141 10*3/uL — ABNORMAL LOW (ref 150–400)
RBC: 5.02 MIL/uL (ref 4.22–5.81)
RDW: 15.2 % (ref 11.5–15.5)
WBC: 6.4 10*3/uL (ref 4.0–10.5)

## 2014-03-03 LAB — URINE MICROSCOPIC-ADD ON

## 2014-03-03 LAB — BASIC METABOLIC PANEL
BUN: 104 mg/dL — AB (ref 6–23)
BUN: 105 mg/dL — ABNORMAL HIGH (ref 6–23)
CALCIUM: 7.9 mg/dL — AB (ref 8.4–10.5)
CHLORIDE: 112 meq/L (ref 96–112)
CO2: 8 mEq/L — CL (ref 19–32)
CO2: 15 mEq/L — ABNORMAL LOW (ref 19–32)
CREATININE: 1.64 mg/dL — AB (ref 0.50–1.35)
Calcium: 7.9 mg/dL — ABNORMAL LOW (ref 8.4–10.5)
Chloride: 116 mEq/L — ABNORMAL HIGH (ref 96–112)
Creatinine, Ser: 1.68 mg/dL — ABNORMAL HIGH (ref 0.50–1.35)
GFR calc Af Amer: 42 mL/min — ABNORMAL LOW (ref >90)
GFR calc non Af Amer: 37 mL/min — ABNORMAL LOW (ref >90)
GFR, EST AFRICAN AMERICAN: 43 mL/min — AB (ref >90)
GFR, EST NON AFRICAN AMERICAN: 36 mL/min — AB (ref >90)
Glucose, Bld: 500 mg/dL — ABNORMAL HIGH (ref 70–99)
Glucose, Bld: 413 mg/dL — ABNORMAL HIGH (ref 70–99)
Potassium: 5.3 mEq/L (ref 3.7–5.3)
Potassium: 4.1 mEq/L (ref 3.7–5.3)
SODIUM: 158 meq/L — AB (ref 137–147)
Sodium: 154 mEq/L — ABNORMAL HIGH (ref 137–147)

## 2014-03-03 LAB — LACTIC ACID, PLASMA
Lactic Acid, Venous: 3.4 mmol/L — ABNORMAL HIGH (ref 0.5–2.2)
Lactic Acid, Venous: 1.8 mmol/L (ref 0.5–2.2)

## 2014-03-03 LAB — COMPREHENSIVE METABOLIC PANEL
ALBUMIN: 2.6 g/dL — AB (ref 3.5–5.2)
ALT: 18 U/L (ref 0–53)
AST: 16 U/L (ref 0–37)
Alkaline Phosphatase: 73 U/L (ref 39–117)
BUN: 127 mg/dL — ABNORMAL HIGH (ref 6–23)
CALCIUM: 9.3 mg/dL (ref 8.4–10.5)
CHLORIDE: 101 meq/L (ref 96–112)
CO2: 9 mEq/L — CL (ref 19–32)
CREATININE: 2.01 mg/dL — AB (ref 0.50–1.35)
GFR calc Af Amer: 34 mL/min — ABNORMAL LOW (ref >90)
GFR, EST NON AFRICAN AMERICAN: 29 mL/min — AB (ref >90)
Glucose, Bld: 640 mg/dL (ref 70–99)
Potassium: 5.3 mEq/L (ref 3.7–5.3)
Sodium: 154 mEq/L — ABNORMAL HIGH (ref 137–147)
Total Bilirubin: 0.7 mg/dL (ref 0.3–1.2)
Total Protein: 5 g/dL — ABNORMAL LOW (ref 6.0–8.3)

## 2014-03-03 LAB — URINALYSIS, ROUTINE W REFLEX MICROSCOPIC
Glucose, UA: >1000 mg/dL — AB
HGB URINE DIPSTICK: NEGATIVE
Ketones, ur: 40 mg/dL — AB
Leukocytes, UA: NEGATIVE
NITRITE: NEGATIVE
PROTEIN: NEGATIVE mg/dL
SPECIFIC GRAVITY, URINE: 1.029 (ref 1.005–1.030)
UROBILINOGEN UA: 1 mg/dL (ref 0.0–1.0)
pH: 5 (ref 5.0–8.0)

## 2014-03-03 LAB — I-STAT ARTERIAL BLOOD GAS, ED
Acid-base deficit: 21 mmol/L — ABNORMAL HIGH (ref 0.0–2.0)
BICARBONATE: 8.6 meq/L — AB (ref 20.0–24.0)
O2 Saturation: 90 %
PH ART: 7.047 — AB (ref 7.350–7.450)
PO2 ART: 84 mmHg (ref 80.0–100.0)
TCO2: 10 mmol/L (ref 0–100)
pCO2 arterial: 31.4 mmHg — ABNORMAL LOW (ref 35.0–45.0)

## 2014-03-03 LAB — CK: Total CK: 249 U/L — ABNORMAL HIGH (ref 7–232)

## 2014-03-03 LAB — POCT I-STAT 3, ART BLOOD GAS (G3+)
ACID-BASE DEFICIT: 14 mmol/L — AB (ref 0.0–2.0)
BICARBONATE: 13 meq/L — AB (ref 20.0–24.0)
O2 SAT: 100 %
TCO2: 14 mmol/L (ref 0–100)
pCO2 arterial: 34.7 mmHg — ABNORMAL LOW (ref 35.0–45.0)
pH, Arterial: 7.183 — CL (ref 7.350–7.450)
pO2, Arterial: 451 mmHg — ABNORMAL HIGH (ref 80.0–100.0)

## 2014-03-03 LAB — TROPONIN I
TROPONIN I: 0.42 ng/mL — AB (ref <0.30)
Troponin I: 1.71 ng/mL (ref <0.30)
Troponin I: 11.22 ng/mL (ref <0.30)

## 2014-03-03 LAB — PROTIME-INR
INR: 1.05 (ref 0.00–1.49)
Prothrombin Time: 13.5 seconds (ref 11.6–15.2)

## 2014-03-03 LAB — RAPID URINE DRUG SCREEN, HOSP PERFORMED
Amphetamines: NOT DETECTED
BENZODIAZEPINES: NOT DETECTED
Barbiturates: NOT DETECTED
Cocaine: NOT DETECTED
Opiates: NOT DETECTED
TETRAHYDROCANNABINOL: NOT DETECTED

## 2014-03-03 LAB — PHOSPHORUS: PHOSPHORUS: 4 mg/dL (ref 2.3–4.6)

## 2014-03-03 LAB — I-STAT VENOUS BLOOD GAS, ED
ACID-BASE DEFICIT: 17 mmol/L — AB (ref 0.0–2.0)
BICARBONATE: 11.4 meq/L — AB (ref 20.0–24.0)
O2 Saturation: 79 %
TCO2: 12 mmol/L (ref 0–100)
pCO2, Ven: 34.6 mmHg — ABNORMAL LOW (ref 45.0–50.0)
pH, Ven: 7.126 — CL (ref 7.250–7.300)
pO2, Ven: 56 mmHg — ABNORMAL HIGH (ref 30.0–45.0)

## 2014-03-03 LAB — MAGNESIUM: MAGNESIUM: 2.3 mg/dL (ref 1.5–2.5)

## 2014-03-03 LAB — CBG MONITORING, ED
GLUCOSE-CAPILLARY: 498 mg/dL — AB (ref 70–99)
GLUCOSE-CAPILLARY: 449 mg/dL — AB (ref 70–99)
Glucose-Capillary: 395 mg/dL — ABNORMAL HIGH (ref 70–99)
Glucose-Capillary: 362 mg/dL — ABNORMAL HIGH (ref 70–99)

## 2014-03-03 LAB — KETONES, QUALITATIVE

## 2014-03-03 MED ORDER — HEPARIN SODIUM (PORCINE) 5000 UNIT/ML IJ SOLN
5000.0000 [IU] | Freq: Three times a day (TID) | INTRAMUSCULAR | Status: DC
  Filled 2014-03-03 (×2): qty 1

## 2014-03-03 MED ORDER — NOREPINEPHRINE BITARTRATE 1 MG/ML IJ SOLN
2.0000 ug/min | INTRAVENOUS | Status: DC
  Administered 2014-03-03 (×2): 10 ug/min via INTRAVENOUS
  Administered 2014-03-04: 5 ug/min via INTRAVENOUS
  Administered 2014-03-04: 10 ug/min via INTRAVENOUS
  Administered 2014-03-04: 12 ug/min via INTRAVENOUS
  Filled 2014-03-03 (×6): qty 4

## 2014-03-03 MED ORDER — SODIUM CHLORIDE 0.9 % IV SOLN: INTRAVENOUS | Status: DC

## 2014-03-03 MED ORDER — SODIUM CHLORIDE 0.9 % IV BOLUS (SEPSIS)
1000.0000 mL | Freq: Once | INTRAVENOUS | Status: AC
  Administered 2014-03-03: 1000 mL via INTRAVENOUS

## 2014-03-03 MED ORDER — ASPIRIN 300 MG RE SUPP
300.0000 mg | Freq: Once | RECTAL | Status: AC
  Administered 2014-03-03: 300 mg via RECTAL
  Filled 2014-03-03: qty 1

## 2014-03-03 MED ORDER — DEXTROSE-NACL 5-0.45 % IV SOLN: INTRAVENOUS | Status: DC

## 2014-03-03 MED ORDER — ALBUTEROL SULFATE (2.5 MG/3ML) 0.083% IN NEBU
2.5000 mg | INHALATION_SOLUTION | RESPIRATORY_TRACT | Status: DC | PRN
  Administered 2014-03-12: 2.5 mg via RESPIRATORY_TRACT
  Filled 2014-03-03: qty 3

## 2014-03-03 MED ORDER — HEPARIN (PORCINE) IN NACL 100-0.45 UNIT/ML-% IJ SOLN
900.0000 [IU]/h | INTRAMUSCULAR | Status: DC
  Administered 2014-03-03: 900 [IU]/h via INTRAVENOUS
  Filled 2014-03-03 (×2): qty 250

## 2014-03-03 MED ORDER — PIPERACILLIN-TAZOBACTAM 3.375 G IVPB 30 MIN
3.3750 g | Freq: Once | INTRAVENOUS | Status: AC
  Administered 2014-03-03: 3.375 g via INTRAVENOUS
  Filled 2014-03-03: qty 50

## 2014-03-03 MED ORDER — VANCOMYCIN HCL IN DEXTROSE 750-5 MG/150ML-% IV SOLN
750.0000 mg | INTRAVENOUS | Status: DC
  Administered 2014-03-04: 750 mg via INTRAVENOUS
  Filled 2014-03-03 (×2): qty 150

## 2014-03-03 MED ORDER — SODIUM CHLORIDE 0.9 % IV SOLN
INTRAVENOUS | Status: DC
  Administered 2014-03-03: 4.4 [IU]/h via INTRAVENOUS
  Filled 2014-03-03: qty 1

## 2014-03-03 MED ORDER — HEPARIN BOLUS VIA INFUSION
4000.0000 [IU] | Freq: Once | INTRAVENOUS | Status: AC
  Administered 2014-03-03: 4000 [IU] via INTRAVENOUS
  Filled 2014-03-03: qty 4000

## 2014-03-03 MED ORDER — PIPERACILLIN-TAZOBACTAM 3.375 G IVPB
3.3750 g | Freq: Three times a day (TID) | INTRAVENOUS | Status: DC
  Administered 2014-03-04 – 2014-03-07 (×12): 3.375 g via INTRAVENOUS
  Filled 2014-03-03 (×15): qty 50

## 2014-03-03 MED ORDER — SODIUM CHLORIDE 0.9 % IV SOLN
INTRAVENOUS | Status: DC
  Administered 2014-03-03 (×2): via INTRAVENOUS

## 2014-03-03 MED ORDER — PANTOPRAZOLE SODIUM 40 MG IV SOLR
40.0000 mg | INTRAVENOUS | Status: DC
  Administered 2014-03-04 – 2014-03-05 (×2): 40 mg via INTRAVENOUS
  Filled 2014-03-03 (×3): qty 40

## 2014-03-03 MED ORDER — SODIUM CHLORIDE 0.9 % IV SOLN: 250.0000 mL | INTRAVENOUS | Status: DC | PRN

## 2014-03-03 MED ORDER — SODIUM CHLORIDE 0.9 % IV SOLN
Freq: Once | INTRAVENOUS | Status: AC
  Administered 2014-03-03: 1000 mL via INTRAVENOUS

## 2014-03-03 MED ORDER — DEXTROSE 50 % IV SOLN: 25.0000 mL | INTRAVENOUS | Status: DC | PRN

## 2014-03-03 MED ORDER — VANCOMYCIN HCL IN DEXTROSE 1-5 GM/200ML-% IV SOLN
1000.0000 mg | Freq: Once | INTRAVENOUS | Status: AC
  Administered 2014-03-03: 1000 mg via INTRAVENOUS
  Filled 2014-03-03: qty 200

## 2014-03-03 MED ORDER — POTASSIUM CHLORIDE 10 MEQ/100ML IV SOLN: 10.0000 meq | INTRAVENOUS | Status: DC

## 2014-03-03 MED ORDER — SODIUM CHLORIDE 0.9 % IV SOLN
INTRAVENOUS | Status: DC
  Administered 2014-03-04: 17.2 [IU]/h via INTRAVENOUS
  Filled 2014-03-03 (×3): qty 1

## 2014-03-03 NOTE — Progress Notes (Signed)
ANTICOAGULATION/ Antibiotic CONSULT NOTE - Initial Consult  Pharmacy Consult for vancomycin, Zosyn and heparin Indication: sepsis and ACS  No Known Allergies  Patient Measurements: Weight: 142 lb 3.2 oz (64.5 kg) Heparin Dosing Weight: 64.5kg  Vital Signs: Temp: 97.2 F (36.2 C) (03/12 2000) Temp src: Rectal (03/12 1550) BP: 123/58 mmHg (03/12 2000) Pulse Rate: 163 (03/12 2000)  Labs:  Recent Labs  03/03/14 1446 03/03/14 1629  HGB 15.2  --   HCT 47.7  --   PLT 141*  --   LABPROT 13.5  --   INR 1.05  --   CREATININE 2.01* 1.64*  CKTOTAL 249*  --   TROPONINI 0.42* 1.71*    CrCl is unknown because there is no height on file for the current visit.   Medical History: Past Medical History  Diagnosis Date  . DM (diabetes mellitus)   . HTN (hypertension)   . CAD (coronary artery disease)   . MI (myocardial infarction)   . Dementia   . Arthritis   . Anxiety   . Hyperlipidemia     Medications:  Prescriptions prior to admission  Medication Sig Dispense Refill  . hydrochlorothiazide (MICROZIDE) 12.5 MG capsule Take 12.5 mg by mouth daily.      Marland Kitchen. lisinopril (PRINIVIL,ZESTRIL) 20 MG tablet Take 20 mg by mouth 2 (two) times daily.      Marland Kitchen. LORazepam (ATIVAN) 2 MG tablet Take 2 mg by mouth daily.      . metFORMIN (GLUCOPHAGE) 500 MG tablet Take 500 mg by mouth 2 (two) times daily with a meal.      . metoprolol (LOPRESSOR) 50 MG tablet Take 25 mg by mouth 2 (two) times daily.      . nitroGLYCERIN (NITROSTAT) 0.4 MG SL tablet Place 0.4 mg under the tongue every 5 (five) minutes as needed for chest pain.      . rosuvastatin (CRESTOR) 10 MG tablet Take 10 mg by mouth daily.        Assessment: 80 yo M found unresponsive on floor of home by EMS, estimated likely down for up to 5 days. Patient with AMS, hypotension, hypothermic, acidotic, in AKI and with critically high BG level. Pharmacy consulted to start vancomycin and Zosyn for sepsis. Initial labs reveal WBC wnl and SCr  2.01. Repeat SCr has now trended down to 1.64 with estimated CrCl ~ 35 mL/min.   Troponins positive and rising.  Heparin infusion to start for ACS  Goal of Therapy:  Heparin level 0.3-0.7 units/ml Monitor platelets by anticoagulation protocol: Yes  Vancomycin trough level 15-20 mcg/ml  Resolution of possible infection    Plan:  Give 4000 units bolus x 1 Start heparin infusion at 900 units/hr Check anti-Xa level in 8 hours and daily while on heparin Continue to monitor H&H and platelets  - give vancomcyin IV 1gm loading dose in ER, followed by 750mg  daily  - plan to draw vancomycin trough at SS  - give Zosyn IV 3.375gm x1 dose in ER (30min infusion), followed by Zosyn 3.375g IV q8h (infuse over 4 hours)   - monitor kidney function, WBC, temperature, and cultures  Mickeal SkinnerFrens, Lameka Disla John 03/03/2014,9:33 PM

## 2014-03-03 NOTE — ED Notes (Signed)
MD Zubelevitskiy at bedside to confirm central line by CVP monitoring.  Central line was arterial not venous. MD Z at bedside for central line placement.

## 2014-03-03 NOTE — ED Provider Notes (Signed)
CSN: [id number]     Arrival date & time 03/03/14  1434 History   First MD Initiated Contact with Patient 03/03/14 1445     Chief Complaint  Patient presents with  . Altered Mental Status     (Consider location/radiation/quality/duration/timing/severity/associated sxs/prior Treatment) HPI Comments: Patient is an 80 year old male with history of PVD status post CEA, cardiac surgery, HTN.  He was brought by EMS from home after he was found on the floor confused and not acting appropriately. He was last seen several days ago by neighbors who went to check on him today. 911 was called and the patient was brought here. In route the patient was altered and somewhat uncooperative. He was also found to be hypotensive with a critically high blood sugar. He is unable to add any additional history due to his altered mental status.  Patient is a 80 y.o. male presenting with altered mental status. The history is provided by the EMS personnel.  Altered Mental Status Presenting symptoms: confusion and unresponsiveness   Severity:  Moderate Most recent episode:  Today Episode history:  Single Timing:  Constant Progression:  Unchanged Chronicity:  New   No past medical history on file. No past surgical history on file. No family history on file. History  Substance Use Topics  . Smoking status: Not on file  . Smokeless tobacco: Not on file  . Alcohol Use: Not on file    Review of Systems  Unable to perform ROS Psychiatric/Behavioral: Positive for confusion.      Allergies  Review of patient's allergies indicates not on file.  Home Medications  No current outpatient prescriptions on file. BP 98/44  Pulse 85  Temp(Src) 91.1 F (32.8 C) (Rectal)  Resp 23  SpO2 100% Physical Exam  Nursing note and vitals reviewed. Constitutional:  Patient is an elderly male who appears acutely ill. He is obtunded and uncooperative.  HENT:  Head: Normocephalic and atraumatic.  There is a contusion  noted to the left side of the 4 head.  Eyes: EOM are normal. Pupils are equal, round, and reactive to light.  Neck: Normal range of motion. Neck supple.  Cardiovascular: Normal rate, regular rhythm and normal heart sounds.   No murmur heard. Pulmonary/Chest: Effort normal and breath sounds normal. No respiratory distress.  Abdominal: Soft. Bowel sounds are normal. He exhibits no distension. There is no tenderness.  Musculoskeletal: Normal range of motion.  Neurological:  Neurologic exam is difficult due to mental status.  He moves all extremities and will open his eyes to voice.  He will follow simple commands, however slowly.  Skin: Skin is warm and dry.    ED Course  Procedures (including critical care time) Labs Review Labs Reviewed  CBC WITH DIFFERENTIAL - Abnormal; Notable for the following:    Platelets 141 (*)    Neutrophils Relative % 81 (*)    Lymphocytes Relative 8 (*)    Lymphs Abs 0.5 (*)    All other components within normal limits  LACTIC ACID, PLASMA - Abnormal; Notable for the following:    Lactic Acid, Venous 3.4 (*)    All other components within normal limits  PROTIME-INR  COMPREHENSIVE METABOLIC PANEL  TROPONIN I  CK  BLOOD GAS, ARTERIAL   Imaging Review No results found.   Date: 03/03/2014  Rate: 102  Rhythm: sinus tachycardia  QRS Axis: normal  Intervals: normal  ST/T Wave abnormalities: nonspecific T wave changes  Conduction Disutrbances:none  Narrative Interpretation:   Old EKG  Reviewed: none available    MDM   Final diagnoses:  None    Patient is an 80 year old male brought to the ER after an episode of unresponsiveness. There is little medical history on file at our facility however it appears as though he has had open-heart surgery and carotid surgery in the past. Also, judging by his medications tears as though he also has a history of hypertension and diabetes. Workup here reveals the patient to be markedly dehydrated, in diabetic  ketoacidosis, and acute renal failure with azotemia. He was also hypothermic with a body temperature rectally of 91. He was given warmed IV fluids and the bear hugger was applied. He was also given 2 L of normal saline and started on the glucose stabilizer. I've spoken with Dr. Kendrick FriesMcQuaid from critical care who will have the patient evaluated by the critical care service.  He has been with decreased level of consciousness however has been protecting his airway and maintaining his saturations. He is bordered on intubation, however I have elected to hold off on this at this time. If his condition worsens he may require this.   CRITICAL CARE Performed by: Geoffery LyonseLo, Markeshia Giebel Total critical care time: 60 minutes Critical care time was exclusive of separately billable procedures and treating other patients. Critical care was necessary to treat or prevent imminent or life-threatening deterioration. Critical care was time spent personally by me on the following activities: development of treatment plan with patient and/or surrogate as well as nursing, discussions with consultants, evaluation of patient's response to treatment, examination of patient, obtaining history from patient or surrogate, ordering and performing treatments and interventions, ordering and review of laboratory studies, ordering and review of radiographic studies, pulse oximetry and re-evaluation of patient's condition.      Geoffery Lyonsouglas Aveion Nguyen, MD 03/03/14 [phone]

## 2014-03-03 NOTE — ED Notes (Signed)
Bair Hugger applied.  MD Delo at bedside for evaluation.

## 2014-03-03 NOTE — ED Notes (Signed)
MD Celine Mansesai at bedside with ultrasound machine for central line placement.

## 2014-03-03 NOTE — ED Notes (Signed)
Pt returned from CT with RN, monitor, and O2.

## 2014-03-03 NOTE — Progress Notes (Signed)
eLink Physician-Brief Progress Note Patient Name: [name] GustRobert [name] DOB: 02/27/1929 MRN: [id number]  Date of Service  03/03/2014   HPI/Events of Note   Metabolic acidosis in setting of hyperglycemia, improving lactic acid  eICU Interventions  Repeat ABG    Intervention Category Major Interventions: Acid-Base disturbance - evaluation and management  [name] [name] 03/03/2014, 8:20 PM

## 2014-03-03 NOTE — H&P (Signed)
PULMONARY / CRITICAL CARE MEDICINE   Name: [name] [name] MRN: [id number] DOB: 1929-07-28    ADMISSION DATE:  03/03/2014  REFERRING MD :  EDP PRIMARY SERVICE: PCCM  CHIEF COMPLAINT:  Found down  BRIEF PATIENT DESCRIPTION: 80 yo with HTN, CAD admitted 3/12 after being found unresponsive (likely x 4-5 days). Hypotensive, hypothermic, hyperglycemic, acidotic, in acute renal failure.   SIGNIFICANT EVENTS / STUDIES:  3/12  Head / c-spine CT >>> nad  LINES / TUBES: L IJ CVL 3/12 >>> Foley 3/12 >>>  CULTURES: 3/12  Blood >>> 3/12  Urine >>>  ANTIBIOTICS: Vancomycin 3/12>>> Zosyn 3/12 >>>  The patient is encephalopathic and unable to provide history, which was obtained for available medical records.  HISTORY OF PRESENT ILLNESS:  80 yo male with hx HTN, DM, CAD, MI presented 3/12 after being found unresponsive.  Pt last seen ~5 days prior to admit by family at which time he was reportedly confused, not himself.  Neighbors called EMS r/t multiple newspapers outside.  Unresponsive, hypotensive, hypothermic on EMS arrival, found in urine and fesces with multiple pressure ulcers.  ?DKA. BP 80's in ER despite 3L fluids, hypothermic with temp 91.   PAST MEDICAL HISTORY :  Past Medical History  Diagnosis Date  . DM (diabetes mellitus)   . HTN (hypertension)   . CAD (coronary artery disease)   . MI (myocardial infarction)   . Dementia   . Arthritis   . Anxiety   . Hyperlipidemia    Past Surgical History  Procedure Laterality Date  . Coronary artery bypass graft    . Carotid endarterectomy     Prior to Admission medications   Not on File   No Known Allergies  FAMILY HISTORY:  No family history on file.  SOCIAL HISTORY:  reports that he quit smoking about 35 years ago. His smoking use included Cigarettes. He has a 25 pack-year smoking history. He does not have any smokeless tobacco history on file. His alcohol and drug histories are not on file.  REVIEW OF SYSTEMS:  Unable to  provide  VITAL SIGNS: Temp:  [91.1 F (32.8 C)] 91.1 F (32.8 C) (03/12 1550) Pulse Rate:  [63-89] 88 (03/12 1615) Resp:  [17-24] 20 (03/12 1615) BP: (85-125)/(39-92) 89/43 mmHg (03/12 1615) SpO2:  [88 %-100 %] 100 % (03/12 1615)  HEMODYNAMICS:   VENTILATOR SETTINGS:   INTAKE / OUTPUT: Intake/Output   None     PHYSICAL EXAMINATION: General:  Frail, thin, chronically ill appearing male, dirty, disheveled  Neuro:  Lethargic but arousable, follows commands, protecting airway, MAE, gen weakness HEENT:  Mm dry, pale, no JVD, cervical collar Cardiovascular:  s1s2 rrr, sternotomy scar  Lungs:  resps even non labored on NRB, few scattered rhonchi  Abdomen:  Soft, nt, +bs Musculoskeletal:  Pale, multiple small pressure ulcers, no edema   LABS:  CBC  Recent Labs Lab 03/03/14 1446  WBC 6.4  HGB 15.2  HCT 47.7  PLT 141*   Coag's  Recent Labs Lab 03/03/14 1446  INR 1.05   BMET  Recent Labs Lab 03/03/14 1446  NA 154*  K 5.3  CL 101  CO2 9*  BUN 127*  CREATININE 2.01*  GLUCOSE 640*   Electrolytes  Recent Labs Lab 03/03/14 1446  CALCIUM 9.3   Sepsis Markers  Recent Labs Lab 03/03/14 1447  LATICACIDVEN 3.4*   ABG No results found for this basename: PHART, PCO2ART, PO2ART,  in the last 168 hours Liver Enzymes  Recent Labs Lab 03/03/14  1446  AST 16  ALT 18  ALKPHOS 73  BILITOT 0.7  ALBUMIN 2.6*   Cardiac Enzymes  Recent Labs Lab 03/03/14 1446  TROPONINI 0.42*   Glucose No results found for this basename: GLUCAP,  in the last 168 hours  Imaging Ct Head Wo Contrast  03/03/2014   CLINICAL DATA:  Confused, unresponsive  EXAM: CT HEAD WITHOUT CONTRAST  CT CERVICAL SPINE WITHOUT CONTRAST  TECHNIQUE: Multidetector CT imaging of the head and cervical spine was performed following the standard protocol without intravenous contrast. Multiplanar CT image reconstructions of the cervical spine were also generated.  COMPARISON:  None.  FINDINGS: CT  HEAD FINDINGS  There is no evidence of mass effect, midline shift, or extra-axial fluid collections. There is no evidence of a space-occupying lesion or intracranial hemorrhage. There is no evidence of a cortical-based area of acute infarction. There is generalized cerebral atrophy. There is periventricular white matter low attenuation likely secondary to microangiopathy.  The ventricles and sulci are appropriate for the patient's age. The basal cisterns are patent.  Visualized portions of the orbits are unremarkable. The visualized portions of the paranasal sinuses and mastoid air cells are unremarkable. Cerebrovascular atherosclerotic calcifications are noted.  The osseous structures are unremarkable.  CT CERVICAL SPINE FINDINGS  The alignment is anatomic. The vertebral body heights are maintained. There is no acute fracture. There is no static listhesis. The prevertebral soft tissues are normal. The intraspinal soft tissues are not fully imaged on this examination due to poor soft tissue contrast, but there is no gross soft tissue abnormality.  There is degenerative disc disease throughout the cervical spine. There broad-based disc bulges at C2-3, C3-4, C4-5, C5-6 and C6-7. There is bilateral facet arthropathy extending from C3-4 through C7-T1. There is bilateral foraminal stenosis at C5-6 and C6-7. Marland Kitchen. There is unilateral right foraminal stenosis at C3-4 and C4-5.  There is left carotid artery atherosclerosis. There is evidence of prior right carotid endarterectomy.  IMPRESSION: 1. No acute intracranial pathology. 2. No acute osseous injury of the cervical spine. 3. Diffuse cervical spine spondylosis as described above.   Electronically Signed   By: Elige KoHetal  Patel   On: 03/03/2014 16:00   Ct Cervical Spine Wo Contrast  03/03/2014   CLINICAL DATA:  Confused, unresponsive  EXAM: CT HEAD WITHOUT CONTRAST  CT CERVICAL SPINE WITHOUT CONTRAST  TECHNIQUE: Multidetector CT imaging of the head and cervical spine was  performed following the standard protocol without intravenous contrast. Multiplanar CT image reconstructions of the cervical spine were also generated.  COMPARISON:  None.  FINDINGS: CT HEAD FINDINGS  There is no evidence of mass effect, midline shift, or extra-axial fluid collections. There is no evidence of a space-occupying lesion or intracranial hemorrhage. There is no evidence of a cortical-based area of acute infarction. There is generalized cerebral atrophy. There is periventricular white matter low attenuation likely secondary to microangiopathy.  The ventricles and sulci are appropriate for the patient's age. The basal cisterns are patent.  Visualized portions of the orbits are unremarkable. The visualized portions of the paranasal sinuses and mastoid air cells are unremarkable. Cerebrovascular atherosclerotic calcifications are noted.  The osseous structures are unremarkable.  CT CERVICAL SPINE FINDINGS  The alignment is anatomic. The vertebral body heights are maintained. There is no acute fracture. There is no static listhesis. The prevertebral soft tissues are normal. The intraspinal soft tissues are not fully imaged on this examination due to poor soft tissue contrast, but there is no gross soft tissue  abnormality.  There is degenerative disc disease throughout the cervical spine. There broad-based disc bulges at C2-3, C3-4, C4-5, C5-6 and C6-7. There is bilateral facet arthropathy extending from C3-4 through C7-T1. There is bilateral foraminal stenosis at C5-6 and C6-7. Marland Kitchen There is unilateral right foraminal stenosis at C3-4 and C4-5.  There is left carotid artery atherosclerosis. There is evidence of prior right carotid endarterectomy.  IMPRESSION: 1. No acute intracranial pathology. 2. No acute osseous injury of the cervical spine. 3. Diffuse cervical spine spondylosis as described above.   Electronically Signed   By: Elige Ko   On: 03/03/2014 16:00   ASSESSMENT /  PLAN:  PULMONARY A: Protects airway at this time At risk for aspiration At risk for respiratory failure due to high metabolic demand P:   Goal SpO2>92 Supplemental oxygen CXR BiPAP contraindicated May need airway   CARDIOVASCULAR A: SIRS / Sepsis Hx HTN, CAD, MI, s/p CABG P:  Goal MAP>92 Levophed gtt Trend troponin / lactate 12-lead EKG  RENAL A: Acute renal failure  Possible rhabdomyolysis Hypovolemia Hypernatremia  Gap metabolic acidosis acidosis P:   Goal CVP 8-10 IFV per DKA protocol Trend BMP  GASTROINTESTINAL A: Nutrition GIPx P:   NPO  Protonix  HEMATOLOGIC A: Thrombocytopenia VTE Px P:  Trend CBC Heparin  INFECTIOUS A: Suspected sepsis No clear infectious source P:   Cx / abx as above PCT  ENDOCRINE A: DM  DKA  Unknown adrenal function P:   DKA protocol Ketones Cortisol  NEUROLOGIC A: Acute encephalopathy  P:   Drug screen C-collar for now  [facility], NP 03/03/2014  4:46 PM Pager: [phone] or [phone]  I have personally obtained history, examined patient, evaluated and interpreted laboratory and imaging results, reviewed medical records, formulated assessment / plan and placed orders.  CRITICAL CARE:  The patient is critically ill with multiple organ systems failure and requires high complexity decision making for assessment and support, frequent evaluation and titration of therapies, application of advanced monitoring technologies and extensive interpretation of multiple databases. Critical Care Time devoted to patient care services described in this note is 45 minutes.   Lonia Farber, MD Pulmonary and Critical Care Medicine [facility] Pager: [phone]  03/03/2014, 5:21 PM

## 2014-03-03 NOTE — ED Notes (Signed)
Portable chest xray at bedside.

## 2014-03-03 NOTE — ED Notes (Signed)
Patient transported to CT with RN, monitor, and O2.

## 2014-03-03 NOTE — ED Notes (Signed)
Dr. Kathrin PennerMcQuade called regarding central line placement. Do not use. Wants a blood gas drawn. Resp. Called.

## 2014-03-03 NOTE — ED Provider Notes (Signed)
I was handed EKG  EKG Interpretation  Date/Time:  Thursday March 03 2014 19:34:09 EDT Ventricular Rate:  117 PR Interval:  90 QRS Duration: 96 QT Interval:  306 QTC Calculation: 427 R Axis:   -9 Text Interpretation:  Sinus tachycardia Multiple premature complexes, vent  Probable left atrial enlargement Abnormal R-wave progression, early transition Repol abnrm, severe global ischemia (LM/MVD) Confirmed by Bebe ShaggyWICKLINE  MD, Elissia Spiewak ([id number]) on 03/03/2014 7:44:25 PM      Pt has diffuse depression across anterior leads.  Posterior infarct is a consideration I spoke to dr Kathrin Pennermcquade with critical care.  He has reviewed EKG.  He will call cardiology   Joya Gaskinsonald W Geovanie Winnett, MD 03/03/14 1945

## 2014-03-03 NOTE — ED Notes (Signed)
MD consult at bedside for evaluation. 

## 2014-03-03 NOTE — Procedures (Signed)
Central Venous Catheter Insertion Procedure Note Jumar Satterly [id number] 11/18/1929  Procedure: Insertion of CBeverly Gustentral Venous Catheter Indications: Assessment of intravascular volume, Drug and/or fluid administration and Frequent blood sampling  Procedure Details Consent: Risks of procedure as well as the alternatives and risks of each were explained to the (patient/caregiver).  Consent for procedure obtained. and Unable to obtain consent because of altered level of consciousness. Time Out: Verified patient identification, verified procedure, site/side was marked, verified correct patient position, special equipment/implants available, medications/allergies/relevent history reviewed, required imaging and test results available.  Performed  Maximum sterile technique was used including antiseptics, cap, gloves, gown, hand hygiene, mask and sheet. Skin prep: Chlorhexidine; local anesthetic administered A antimicrobial bonded/coated triple lumen catheter was placed in the left internal jugular vein using the Seldinger technique.  Evaluation Blood flow good Complications: No apparent complications Patient did tolerate procedure well. Chest X-ray ordered to verify placement.  CXR: pending.   Rutherford Guysahul Desai, PA - [facility] [place] [facility] Pgr: [phone]  or [phone]  Supervised and  present through the entire procedure.  Lonia FarberZUBELEVITSKIY, Christie Viscomi, MD Pulmonary and Critical Care Medicine [facility] Pager: [phone]) [phone]

## 2014-03-03 NOTE — Procedures (Signed)
Name:  [name] GustRobert Branden MRN:  [id number] DOB:  08/10/1929  PROCEDURE NOTE  Procedure:  Central venous catheter placement.  Indications:  Need for intravenous access and hemodynamic monitoring.  Consent:  Consent was implied due to the emergency nature of the procedure.  Anesthesia:  A total of 10 mL of 1% Lidocaine was used for local infiltration anesthesia.  Procedure summary:  Appropriate equipment was assembled.  The patient was identified as [name] [name] and safety timeout was performed. The patient was placed in Trendelenburg position.  Sterile technique was used. The patient's left anterior chest wall was prepped using chlorhexidine / alcohol scrub and the field was draped in usual sterile fashion with full body drape. After the adequate anesthesia was achieved, the left subclavian vein was cannulated with the introducer needle without difficulty. A guide wire was advanced through the introducer needle, which was then withdrawn. A small skin incision was made at the point of wire entry, the dilator was inserted over the guide wire and appropriate dilation was obtained. The dilator was removed and triple-lumen catheter was advanced over the guide wire, which was then removed.  All ports were aspirated and flushed with normal saline without difficulty. The catheter was secured into place. Antibiotic patch was placed and sterile dressing was applied. Post-procedure chest x-ray was ordered.  Complications:  No immediate complications were noted.  Hemodynamic parameters and oxygenation remained stable throughout the procedure.  Estimated blood loss:  Less then 5 mL.  Lonia FarberZUBELEVITSKIY, Makita Blow, MD Pulmonary and Critical Care Medicine [facility] Cell: [phone]) [phone]  03/03/2014, 6:24 PM

## 2014-03-03 NOTE — Progress Notes (Signed)
eLink Physician-Brief Progress Note Patient Name: [name] GustRobert [name] DOB: 06/21/1929 MRN: [id number]  Date of Service  03/03/2014   HPI/Events of Note   EKG with what appears to be new ST depression, possible evolving change Patient remains unresponsive  eICU Interventions  ASA now pr, discussed with RN Cardiology consult   Intervention Category Intermediate Interventions: Diagnostic test evaluation  MCQUAID, DOUGLAS 03/03/2014, 7:50 PM

## 2014-03-03 NOTE — ED Notes (Signed)
Pt arrived being assisted with ventilations on arrival to the ED.  EMS reports pt was confused and altered on Friday per family.  Family reports pt normally is alert and goes to [facility] by self every morning.  Pt was found unresponsive on the floor today. EMS reports 5 news paper laying out in yard. Pt has not had medications in 5 days.  Pt arrived with urine and poop onto self.  Critical high CBG.  72/40 BP  Resp rate en route decreased to 6.  -  750cc of NS given PTA.

## 2014-03-03 NOTE — Progress Notes (Signed)
eLink Physician-Brief Progress Note Patient Name: [name] GustRobert Maslanka DOB: 04/30/1929 MRN: [id number]  Date of Service  03/03/2014   HPI/Events of Note   Left subclavian line also positioned in left mediastinum; radiology questions left lying SVC?  eICU Interventions  Send ABG from line   Intervention Category Minor Interventions: Communication with other healthcare providers and/or family  Max FickleMCQUAID, DOUGLAS 03/03/2014, 7:35 PM

## 2014-03-03 NOTE — Consult Note (Addendum)
Referring Physician: Critical care medicine service - Dr. Kendrick Fries Primary Physician: No primary provider on file.  Reason for Consultation: Patient being seen in the request of critical care medicine service for further evaluation of abnormal EKG with elevated troponins  HPI: The time of my evaluation patient has altered mental status and barely opens his eyes to voice. He is not able to provide any history therefore all history was obtained from medical records and a personal.  According to our notes this is 80 yo male with hx HTN, DM, CAD, MI presented 3/12 after being found unresponsive. Pt last seen ~5 days prior to admit by family at which time he was reportedly confused, not himself. Neighbors called EMS after noticing multiple newspapers outside. During EMS evaluation he was found to be unresponsive, hypotensive, hypothermic, was found in urine and fesces with multiple pressure ulcers.   Review of Systems:  Unable to obtain any history due to altered mental status      Past Medical History  Diagnosis Date  . DM (diabetes mellitus)   . HTN (hypertension)   . CAD (coronary artery disease)   . MI (myocardial infarction)   . Dementia   . Arthritis   . Anxiety   . Hyperlipidemia    Past Surgical History  Procedure Laterality Date  . Coronary artery bypass graft    . Carotid endarterectomy       Current Medications: . pantoprazole (PROTONIX) IV  40 mg Intravenous Q24H  . [START ON 03/04/2014] piperacillin-tazobactam (ZOSYN)  IV  3.375 g Intravenous 3 times per day  . [START ON 03/04/2014] vancomycin  750 mg Intravenous Q24H   Infusions:  . sodium chloride 150 mL/hr at 03/03/14 1752  . dextrose 5 % and 0.45% NaCl    . insulin (NOVOLIN-R) infusion 18.4 Units/hr (03/03/14 2100)  . norepinephrine (LEVOPHED) Adult infusion 20 mcg/min (03/03/14 1734)    Prescriptions prior to admission  Medication Sig Dispense Refill  . hydrochlorothiazide (MICROZIDE) 12.5 MG capsule Take  12.5 mg by mouth daily.      Marland Kitchen lisinopril (PRINIVIL,ZESTRIL) 20 MG tablet Take 20 mg by mouth 2 (two) times daily.      Marland Kitchen LORazepam (ATIVAN) 2 MG tablet Take 2 mg by mouth daily.      . metFORMIN (GLUCOPHAGE) 500 MG tablet Take 500 mg by mouth 2 (two) times daily with a meal.      . metoprolol (LOPRESSOR) 50 MG tablet Take 25 mg by mouth 2 (two) times daily.      . nitroGLYCERIN (NITROSTAT) 0.4 MG SL tablet Place 0.4 mg under the tongue every 5 (five) minutes as needed for chest pain.      . rosuvastatin (CRESTOR) 10 MG tablet Take 10 mg by mouth daily.         No Known Allergies  History   Social History  . Marital Status: Widowed    Spouse Name: N/A    Number of Children: N/A  . Years of Education: N/A   Occupational History  . Not on file.   Social History Main Topics  . Smoking status: Former Smoker -- 1.00 packs/day for 25 years    Types: Cigarettes    Quit date: 03/04/1979  . Smokeless tobacco: Not on file  . Alcohol Use: Not on file  . Drug Use: Not on file  . Sexual Activity: Not on file   Other Topics Concern  . Not on file   Social History Narrative  . No narrative  on file    No family history on file. No family status information on file.    PHYSICAL EXAM: Filed Vitals:   03/03/14 2000  BP: 123/58  Pulse: 163  Temp: 97.2 F (36.2 C)  Resp: 24     Intake/Output Summary (Last 24 hours) at 03/03/14 2138 Last data filed at 03/03/14 2100  Gross per 24 hour  Intake      0 ml  Output    700 ml  Net   -700 ml    General:  Well appearing. No respiratory difficulty HEENT: normal Neck:  C-collar in place. Cor: PMI hyperdynamic.  tachycardic with occasional PVCs, No rubs, gallops or murmurs. CVP on the central line is 0 to negative, sternotomy scar Lungs:  Coarse breath sounds bilaterally, no rhonchi him wheezings Abdomen: soft, mildly tender in the epigastrium, nondistended. No hepatosplenomegaly. No bruits or masses. Good bowel sounds. Extremities:  no cyanosis, clubbing, rash, edema Neuro: alert & oriented x 3, cranial nerves grossly intact. moves all 4 extremities w/o difficulty. Affect pleasant.    Results for orders placed during the hospital encounter of 03/03/14 (from the past 24 hour(s))  CBC WITH DIFFERENTIAL     Status: Abnormal   Collection Time    03/03/14  2:46 PM      Result Value Ref Range   WBC 6.4  4.0 - 10.5 K/uL   RBC 5.02  4.22 - 5.81 MIL/uL   Hemoglobin 15.2  13.0 - 17.0 g/dL   HCT 47.4  25.9 - 56.3 %   MCV 95.0  78.0 - 100.0 fL   MCH 30.3  26.0 - 34.0 pg   MCHC 31.9  30.0 - 36.0 g/dL   RDW 87.5  64.3 - 32.9 %   Platelets 141 (*) 150 - 400 K/uL   Neutrophils Relative % 81 (*) 43 - 77 %   Neutro Abs 5.2  1.7 - 7.7 K/uL   Lymphocytes Relative 8 (*) 12 - 46 %   Lymphs Abs 0.5 (*) 0.7 - 4.0 K/uL   Monocytes Relative 11  3 - 12 %   Monocytes Absolute 0.7  0.1 - 1.0 K/uL   Eosinophils Relative 0  0 - 5 %   Eosinophils Absolute 0.0  0.0 - 0.7 K/uL   Basophils Relative 0  0 - 1 %   Basophils Absolute 0.0  0.0 - 0.1 K/uL  COMPREHENSIVE METABOLIC PANEL     Status: Abnormal   Collection Time    03/03/14  2:46 PM      Result Value Ref Range   Sodium 154 (*) 137 - 147 mEq/L   Potassium 5.3  3.7 - 5.3 mEq/L   Chloride 101  96 - 112 mEq/L   CO2 9 (*) 19 - 32 mEq/L   Glucose, Bld 640 (*) 70 - 99 mg/dL   BUN 518 (*) 6 - 23 mg/dL   Creatinine, Ser 8.41 (*) 0.50 - 1.35 mg/dL   Calcium 9.3  8.4 - 66.0 mg/dL   Total Protein 5.0 (*) 6.0 - 8.3 g/dL   Albumin 2.6 (*) 3.5 - 5.2 g/dL   AST 16  0 - 37 U/L   ALT 18  0 - 53 U/L   Alkaline Phosphatase 73  39 - 117 U/L   Total Bilirubin 0.7  0.3 - 1.2 mg/dL   GFR calc non Af Amer 29 (*) >90 mL/min   GFR calc Af Amer 34 (*) >90 mL/min  PROTIME-INR     Status: None  Collection Time    03/03/14  2:46 PM      Result Value Ref Range   Prothrombin Time 13.5  11.6 - 15.2 seconds   INR 1.05  0.00 - 1.49  TROPONIN I     Status: Abnormal   Collection Time    03/03/14  2:46  PM      Result Value Ref Range   Troponin I 0.42 (*) <0.30 ng/mL  CK     Status: Abnormal   Collection Time    03/03/14  2:46 PM      Result Value Ref Range   Total CK 249 (*) 7 - 232 U/L  LACTIC ACID, PLASMA     Status: Abnormal   Collection Time    03/03/14  2:47 PM      Result Value Ref Range   Lactic Acid, Venous 3.4 (*) 0.5 - 2.2 mmol/L  MAGNESIUM     Status: None   Collection Time    03/03/14  4:29 PM      Result Value Ref Range   Magnesium 2.3  1.5 - 2.5 mg/dL  PHOSPHORUS     Status: None   Collection Time    03/03/14  4:29 PM      Result Value Ref Range   Phosphorus 4.0  2.3 - 4.6 mg/dL  TROPONIN I     Status: Abnormal   Collection Time    03/03/14  4:29 PM      Result Value Ref Range   Troponin I 1.71 (*) <0.30 ng/mL  LACTIC ACID, PLASMA     Status: None   Collection Time    03/03/14  4:29 PM      Result Value Ref Range   Lactic Acid, Venous 1.8  0.5 - 2.2 mmol/L  KETONES, QUALITATIVE     Status: Abnormal   Collection Time    03/03/14  4:29 PM      Result Value Ref Range   Acetone, Bld MODERATE (*) NEGATIVE  BASIC METABOLIC PANEL     Status: Abnormal   Collection Time    03/03/14  4:29 PM      Result Value Ref Range   Sodium 154 (*) 137 - 147 mEq/L   Potassium 5.3  3.7 - 5.3 mEq/L   Chloride 112  96 - 112 mEq/L   CO2 8 (*) 19 - 32 mEq/L   Glucose, Bld 500 (*) 70 - 99 mg/dL   BUN 161 (*) 6 - 23 mg/dL   Creatinine, Ser 0.96 (*) 0.50 - 1.35 mg/dL   Calcium 7.9 (*) 8.4 - 10.5 mg/dL   GFR calc non Af Amer 37 (*) >90 mL/min   GFR calc Af Amer 43 (*) >90 mL/min  CBG MONITORING, ED     Status: Abnormal   Collection Time    03/03/14  4:57 PM      Result Value Ref Range   Glucose-Capillary 498 (*) 70 - 99 mg/dL  URINALYSIS, ROUTINE W REFLEX MICROSCOPIC     Status: Abnormal   Collection Time    03/03/14  5:21 PM      Result Value Ref Range   Color, Urine AMBER (*) YELLOW   APPearance CLEAR  CLEAR   Specific Gravity, Urine 1.029  1.005 - 1.030   pH 5.0   5.0 - 8.0   Glucose, UA >1000 (*) NEGATIVE mg/dL   Hgb urine dipstick NEGATIVE  NEGATIVE   Bilirubin Urine MODERATE (*) NEGATIVE   Ketones, ur 40 (*) NEGATIVE mg/dL  Protein, ur NEGATIVE  NEGATIVE mg/dL   Urobilinogen, UA 1.0  0.0 - 1.0 mg/dL   Nitrite NEGATIVE  NEGATIVE   Leukocytes, UA NEGATIVE  NEGATIVE  URINE RAPID DRUG SCREEN (HOSP PERFORMED)     Status: None   Collection Time    03/03/14  5:21 PM      Result Value Ref Range   Opiates NONE DETECTED  NONE DETECTED   Cocaine NONE DETECTED  NONE DETECTED   Benzodiazepines NONE DETECTED  NONE DETECTED   Amphetamines NONE DETECTED  NONE DETECTED   Tetrahydrocannabinol NONE DETECTED  NONE DETECTED   Barbiturates NONE DETECTED  NONE DETECTED  URINE MICROSCOPIC-ADD ON     Status: Abnormal   Collection Time    03/03/14  5:21 PM      Result Value Ref Range   Squamous Epithelial / LPF RARE  RARE   WBC, UA 0-2  <3 WBC/hpf   RBC / HPF 0-2  <3 RBC/hpf   Bacteria, UA RARE  RARE   Casts HYALINE CASTS (*) NEGATIVE  I-STAT ARTERIAL BLOOD GAS, ED     Status: Abnormal   Collection Time    03/03/14  5:59 PM      Result Value Ref Range   pH, Arterial 7.047 (*) 7.350 - 7.450   pCO2 arterial 31.4 (*) 35.0 - 45.0 mmHg   pO2, Arterial 84.0  80.0 - 100.0 mmHg   Bicarbonate 8.6 (*) 20.0 - 24.0 mEq/L   TCO2 10  0 - 100 mmol/L   O2 Saturation 90.0     Acid-base deficit 21.0 (*) 0.0 - 2.0 mmol/L   Collection site RADIAL, ALLEN'S TEST ACCEPTABLE     Drawn by Operator     Sample type ARTERIAL     Comment NOTIFIED PHYSICIAN    CBG MONITORING, ED     Status: Abnormal   Collection Time    03/03/14  6:01 PM      Result Value Ref Range   Glucose-Capillary 449 (*) 70 - 99 mg/dL  CBG MONITORING, ED     Status: Abnormal   Collection Time    03/03/14  7:11 PM      Result Value Ref Range   Glucose-Capillary 395 (*) 70 - 99 mg/dL  I-STAT VENOUS BLOOD GAS, ED     Status: Abnormal   Collection Time    03/03/14  7:58 PM      Result Value Ref Range    pH, Ven 7.126 (*) 7.250 - 7.300   pCO2, Ven 34.6 (*) 45.0 - 50.0 mmHg   pO2, Ven 56.0 (*) 30.0 - 45.0 mmHg   Bicarbonate 11.4 (*) 20.0 - 24.0 mEq/L   TCO2 12  0 - 100 mmol/L   O2 Saturation 79.0     Acid-base deficit 17.0 (*) 0.0 - 2.0 mmol/L   Sample type VENOUS     Comment NOTIFIED PHYSICIAN    CBG MONITORING, ED     Status: Abnormal   Collection Time    03/03/14  8:03 PM      Result Value Ref Range   Glucose-Capillary 362 (*) 70 - 99 mg/dL   Comment 1 Documented in Chart    POCT I-STAT 3, BLOOD GAS (G3+)     Status: Abnormal   Collection Time    03/03/14  8:38 PM      Result Value Ref Range   pH, Arterial 7.183 (*) 7.350 - 7.450   pCO2 arterial 34.7 (*) 35.0 - 45.0 mmHg   pO2, Arterial  451.0 (*) 80.0 - 100.0 mmHg   Bicarbonate 13.0 (*) 20.0 - 24.0 mEq/L   TCO2 14  0 - 100 mmol/L   O2 Saturation 100.0     Acid-base deficit 14.0 (*) 0.0 - 2.0 mmol/L   Patient temperature 98.6 F     Collection site RADIAL, ALLEN'S TEST ACCEPTABLE     Drawn by RT     Sample type ARTERIAL     Comment NOTIFIED PHYSICIAN     Radiology:  Ct Head Wo Contrast  03/03/2014   CLINICAL DATA:  Confused, unresponsive  EXAM: CT HEAD WITHOUT CONTRAST  CT CERVICAL SPINE WITHOUT CONTRAST  TECHNIQUE: Multidetector CT imaging of the head and cervical spine was performed following the standard protocol without intravenous contrast. Multiplanar CT image reconstructions of the cervical spine were also generated.  COMPARISON:  None.  FINDINGS: CT HEAD FINDINGS  There is no evidence of mass effect, midline shift, or extra-axial fluid collections. There is no evidence of a space-occupying lesion or intracranial hemorrhage. There is no evidence of a cortical-based area of acute infarction. There is generalized cerebral atrophy. There is periventricular white matter low attenuation likely secondary to microangiopathy.  The ventricles and sulci are appropriate for the patient's age. The basal cisterns are patent.  Visualized  portions of the orbits are unremarkable. The visualized portions of the paranasal sinuses and mastoid air cells are unremarkable. Cerebrovascular atherosclerotic calcifications are noted.  The osseous structures are unremarkable.  CT CERVICAL SPINE FINDINGS  The alignment is anatomic. The vertebral body heights are maintained. There is no acute fracture. There is no static listhesis. The prevertebral soft tissues are normal. The intraspinal soft tissues are not fully imaged on this examination due to poor soft tissue contrast, but there is no gross soft tissue abnormality.  There is degenerative disc disease throughout the cervical spine. There broad-based disc bulges at C2-3, C3-4, C4-5, C5-6 and C6-7. There is bilateral facet arthropathy extending from C3-4 through C7-T1. There is bilateral foraminal stenosis at C5-6 and C6-7. Marland Kitchen. There is unilateral right foraminal stenosis at C3-4 and C4-5.  There is left carotid artery atherosclerosis. There is evidence of prior right carotid endarterectomy.  IMPRESSION: 1. No acute intracranial pathology. 2. No acute osseous injury of the cervical spine. 3. Diffuse cervical spine spondylosis as described above.   Electronically Signed   By: Elige KoHetal  Patel   On: 03/03/2014 16:00   Ct Cervical Spine Wo Contrast  03/03/2014   CLINICAL DATA:  Confused, unresponsive  EXAM: CT HEAD WITHOUT CONTRAST  CT CERVICAL SPINE WITHOUT CONTRAST  TECHNIQUE: Multidetector CT imaging of the head and cervical spine was performed following the standard protocol without intravenous contrast. Multiplanar CT image reconstructions of the cervical spine were also generated.  COMPARISON:  None.  FINDINGS: CT HEAD FINDINGS  There is no evidence of mass effect, midline shift, or extra-axial fluid collections. There is no evidence of a space-occupying lesion or intracranial hemorrhage. There is no evidence of a cortical-based area of acute infarction. There is generalized cerebral atrophy. There is  periventricular white matter low attenuation likely secondary to microangiopathy.  The ventricles and sulci are appropriate for the patient's age. The basal cisterns are patent.  Visualized portions of the orbits are unremarkable. The visualized portions of the paranasal sinuses and mastoid air cells are unremarkable. Cerebrovascular atherosclerotic calcifications are noted.  The osseous structures are unremarkable.  CT CERVICAL SPINE FINDINGS  The alignment is anatomic. The vertebral body heights are maintained. There is no  acute fracture. There is no static listhesis. The prevertebral soft tissues are normal. The intraspinal soft tissues are not fully imaged on this examination due to poor soft tissue contrast, but there is no gross soft tissue abnormality.  There is degenerative disc disease throughout the cervical spine. There broad-based disc bulges at C2-3, C3-4, C4-5, C5-6 and C6-7. There is bilateral facet arthropathy extending from C3-4 through C7-T1. There is bilateral foraminal stenosis at C5-6 and C6-7. Marland Kitchen There is unilateral right foraminal stenosis at C3-4 and C4-5.  There is left carotid artery atherosclerosis. There is evidence of prior right carotid endarterectomy.  IMPRESSION: 1. No acute intracranial pathology. 2. No acute osseous injury of the cervical spine. 3. Diffuse cervical spine spondylosis as described above.   Electronically Signed   By: Elige Ko   On: 03/03/2014 16:00   Dg Chest Portable 1 View  03/03/2014   CLINICAL DATA:  Altered mental status.  EXAM: PORTABLE CHEST - 1 VIEW  COMPARISON:  March 03, 2014.  FINDINGS: Stable cardiomediastinal silhouette. Status post coronary artery bypass graft. No pneumothorax or pleural effusion is noted. No acute pulmonary disease is noted. Left internal jugular catheter has been removed and replaced with left subclavian catheter; this catheter also projects over the left side of the mediastinum. This either represents intra-arterial position or  congenital venous variant such as left-sided superior vena cava. Bony thorax is intact.  IMPRESSION: No acute cardiopulmonary abnormality seen. Left internal jugular catheter has been removed and replaced with left subclavian catheter, which also demonstrates left-sided position over mediastinum. As described above, this represents either intra-arterial position or congenital venous variant such as left-sided superior vena cava.   Electronically Signed   By: Roque Lias M.D.   On: 03/03/2014 18:39   Dg Chest Port 1 View  03/03/2014   CLINICAL DATA:  Status post line placement.  EXAM: PORTABLE CHEST - 1 VIEW  COMPARISON:  None.  FINDINGS: There is a left internal jugular central venous catheter with the tip projecting over the left aspect of the mediastinum. Patient is rotated, limiting evaluation. Grossly unremarkable mediastinal contours given the patient rotation. Patient status post median sternotomy and CABG procedure. The lungs are well aerated. No large consolidative pulmonary opacities. No pleural effusion or pneumothorax. The left costophrenic angle is excluded from view. Regional skeleton is unremarkable.  IMPRESSION:AAAA: IMPRESSION:AAAA Left internal jugular central venous catheter tip projects over the left aspect of the mediastinum. This is concerning for intra-arterial line placement in the absence of known variant anatomy such as left-sided superior vena cava. Recommend clinical correlation, potentially with arterial blood gas. Additionally followup chest radiograph with repositioning is recommended.  Critical Value/emergent results were called by telephone at the time of interpretation on 03/03/2014 at 5:34 PM to Dr. Marin Shutter, who verbally acknowledged these results.   Electronically Signed   By: Annia Belt M.D.   On: 03/03/2014 17:44    ECG (personally reviewed and interpreted by me):  03/03/2014 at 1441: Baseline wander, sinus tachycardia diffuse ST segment depression in leads  V2-V6 03/03/2014 at 1934: Sinus tachycardia 117 beats per minute, horizontal axis, probable left atrial enlargement, Q waves in leads III and aVF, ST depression horizontal from 2-4 mm in leads V2-V5, leads up to 2 mm in I, II, aVL. AVR elevation up to 1.5 mm    ECHO: I performed a limited bedside echocardiogram, no images saved: Inferolateral wall thinning and akinesis overall hyperdynamic LVEF >65%. Near obliteration LV cavity  ASSESSMENT:  Acute myocardial  infarction - non-ST elevation There is ST elevation only and aVR which is usually a marker of global ischemia, however patients LVEF at the moment is hyperdynamic and his CVP is severely reduced (0 to negative despite 5 L of crystalloids) This is likely type II MI due to severe metabolic derangements including acidosis, hyperglycemia, hypertension requiring aggressive fluid resuscitation Levophed, questionable sepsis on top of underlying coronary artery disease status CABG  Given the fact that his in acute renal failure with underlying severe metabolic derangements, and not adequately fluid resuscitated yet. Will recommend to continue with aggressive fluid resuscitation with goal CVP of at least 8. He also has a history of CABG which makes his cardiac catheterization more challenging and will increase the total amount of contrast. Which will put him at high risk for contrast-induced nephropathy given underlying acute renal failure. We also do not know what his bleeding risk is the amount. In the meanwhile repeat EKG, continue cycling troponin. Continue to reevaluate for the need of going to the cardiac catheterization lab - Continue aspirin   - I have ordered heparin drip - Lipitor 80 mg daily as soon as patient can take anything by mouth, however I would check his total CK first given the fact he was found down   Nolon Nations, MD 03/03/2014 9:38 PM  Troponin is uptrending to 11. However patient did not receive any more fluids since  initial 5 L of crystaloids.   I have spoke to CCM attnding on call. We agree not to activate the cath lab.   MICU nurse will continue to give LR 500 cc boluses until CVP reaches 8 mm Hg. Plan to wean levophed.   If EKG does not improve after adequate volume resuscitation. Will consider LHC

## 2014-03-03 NOTE — ED Notes (Signed)
C-collar applied by RN with EMT holding c-spine precautions.  Pt then log rolled with C-spine precautions maintained by RN to remove back board from pt.

## 2014-03-04 ENCOUNTER — Inpatient Hospital Stay (HOSPITAL_COMMUNITY): Payer: Medicare HMO

## 2014-03-04 ENCOUNTER — Encounter (HOSPITAL_COMMUNITY)
Admission: EM | Disposition: A | Discharge status: Skilled Nursing Facility | Payer: Self-pay | Source: Home / Self Care | Attending: Internal Medicine

## 2014-03-04 ENCOUNTER — Encounter (HOSPITAL_COMMUNITY): Payer: Self-pay | Admitting: *Deleted

## 2014-03-04 DIAGNOSIS — E872 Acidosis, unspecified: Secondary | ICD-10-CM | POA: Diagnosis present

## 2014-03-04 DIAGNOSIS — E785 Hyperlipidemia, unspecified: Secondary | ICD-10-CM

## 2014-03-04 DIAGNOSIS — J9600 Acute respiratory failure, unspecified whether with hypoxia or hypercapnia: Secondary | ICD-10-CM

## 2014-03-04 DIAGNOSIS — R579 Shock, unspecified: Secondary | ICD-10-CM

## 2014-03-04 DIAGNOSIS — I2510 Atherosclerotic heart disease of native coronary artery without angina pectoris: Secondary | ICD-10-CM | POA: Diagnosis present

## 2014-03-04 DIAGNOSIS — G9340 Encephalopathy, unspecified: Secondary | ICD-10-CM

## 2014-03-04 DIAGNOSIS — E860 Dehydration: Secondary | ICD-10-CM

## 2014-03-04 DIAGNOSIS — T68XXXA Hypothermia, initial encounter: Secondary | ICD-10-CM

## 2014-03-04 DIAGNOSIS — I214 Non-ST elevation (NSTEMI) myocardial infarction: Secondary | ICD-10-CM | POA: Diagnosis present

## 2014-03-04 HISTORY — PX: LEFT HEART CATHETERIZATION WITH CORONARY/GRAFT ANGIOGRAM: SHX5450

## 2014-03-04 LAB — GLUCOSE, CAPILLARY
GLUCOSE-CAPILLARY: 102 mg/dL — AB (ref 70–99)
GLUCOSE-CAPILLARY: 104 mg/dL — AB (ref 70–99)
GLUCOSE-CAPILLARY: 108 mg/dL — AB (ref 70–99)
GLUCOSE-CAPILLARY: 129 mg/dL — AB (ref 70–99)
GLUCOSE-CAPILLARY: 181 mg/dL — AB (ref 70–99)
GLUCOSE-CAPILLARY: 181 mg/dL — AB (ref 70–99)
GLUCOSE-CAPILLARY: 305 mg/dL — AB (ref 70–99)
GLUCOSE-CAPILLARY: 357 mg/dL — AB (ref 70–99)
GLUCOSE-CAPILLARY: 428 mg/dL — AB (ref 70–99)
Glucose-Capillary: 344 mg/dL — ABNORMAL HIGH (ref 70–99)
Glucose-Capillary: 272 mg/dL — ABNORMAL HIGH (ref 70–99)
Glucose-Capillary: 225 mg/dL — ABNORMAL HIGH (ref 70–99)
Glucose-Capillary: 202 mg/dL — ABNORMAL HIGH (ref 70–99)
Glucose-Capillary: 170 mg/dL — ABNORMAL HIGH (ref 70–99)
Glucose-Capillary: 127 mg/dL — ABNORMAL HIGH (ref 70–99)
Glucose-Capillary: 92 mg/dL (ref 70–99)
Glucose-Capillary: 85 mg/dL (ref 70–99)
Glucose-Capillary: 129 mg/dL — ABNORMAL HIGH (ref 70–99)
Glucose-Capillary: 173 mg/dL — ABNORMAL HIGH (ref 70–99)
Glucose-Capillary: 222 mg/dL — ABNORMAL HIGH (ref 70–99)
Glucose-Capillary: 261 mg/dL — ABNORMAL HIGH (ref 70–99)
Glucose-Capillary: 229 mg/dL — ABNORMAL HIGH (ref 70–99)
Glucose-Capillary: 260 mg/dL — ABNORMAL HIGH (ref 70–99)
Glucose-Capillary: 206 mg/dL — ABNORMAL HIGH (ref 70–99)

## 2014-03-04 LAB — POCT I-STAT 3, ART BLOOD GAS (G3+)
Acid-base deficit: 17 mmol/L — ABNORMAL HIGH (ref 0.0–2.0)
Bicarbonate: 9.4 mEq/L — ABNORMAL LOW (ref 20.0–24.0)
Bicarbonate: 25.3 mEq/L — ABNORMAL HIGH (ref 20.0–24.0)
O2 Saturation: 100 %
O2 Saturation: 99 %
PCO2 ART: 23.2 mmHg — AB (ref 35.0–45.0)
PH ART: 7.394 (ref 7.350–7.450)
PO2 ART: 432 mmHg — AB (ref 80.0–100.0)
TCO2: 10 mmol/L (ref 0–100)
TCO2: 27 mmol/L (ref 0–100)
pCO2 arterial: 41.3 mmHg (ref 35.0–45.0)
pH, Arterial: 7.217 — ABNORMAL LOW (ref 7.350–7.450)
pO2, Arterial: 136 mmHg — ABNORMAL HIGH (ref 80.0–100.0)

## 2014-03-04 LAB — URINE CULTURE
Colony Count: NO GROWTH
Culture: NO GROWTH

## 2014-03-04 LAB — BASIC METABOLIC PANEL
BUN: 68 mg/dL — ABNORMAL HIGH (ref 6–23)
BUN: 102 mg/dL — AB (ref 6–23)
CO2: 19 mEq/L (ref 19–32)
CO2: 24 meq/L (ref 19–32)
Calcium: 7.9 mg/dL — ABNORMAL LOW (ref 8.4–10.5)
Calcium: 7.4 mg/dL — ABNORMAL LOW (ref 8.4–10.5)
Chloride: 119 mEq/L — ABNORMAL HIGH (ref 96–112)
Chloride: 120 mEq/L — ABNORMAL HIGH (ref 96–112)
Creatinine, Ser: 1.68 mg/dL — ABNORMAL HIGH (ref 0.50–1.35)
Creatinine, Ser: 1.14 mg/dL (ref 0.50–1.35)
GFR calc Af Amer: 67 mL/min — ABNORMAL LOW (ref >90)
GFR calc non Af Amer: 58 mL/min — ABNORMAL LOW (ref >90)
GFR, EST AFRICAN AMERICAN: 42 mL/min — AB (ref >90)
GFR, EST NON AFRICAN AMERICAN: 36 mL/min — AB (ref >90)
GLUCOSE: 325 mg/dL — AB (ref 70–99)
Glucose, Bld: 244 mg/dL — ABNORMAL HIGH (ref 70–99)
Potassium: 4 mEq/L (ref 3.7–5.3)
Potassium: 3.6 mEq/L — ABNORMAL LOW (ref 3.7–5.3)
SODIUM: 156 meq/L — AB (ref 137–147)
SODIUM: 159 meq/L — AB (ref 137–147)

## 2014-03-04 LAB — COMPREHENSIVE METABOLIC PANEL
ALT: 20 U/L (ref 0–53)
AST: 51 U/L — AB (ref 0–37)
Albumin: 1.8 g/dL — ABNORMAL LOW (ref 3.5–5.2)
Alkaline Phosphatase: 50 U/L (ref 39–117)
BILIRUBIN TOTAL: 0.4 mg/dL (ref 0.3–1.2)
BUN: 87 mg/dL — AB (ref 6–23)
CHLORIDE: 121 meq/L — AB (ref 96–112)
CO2: 23 meq/L (ref 19–32)
CREATININE: 1.41 mg/dL — AB (ref 0.50–1.35)
Calcium: 7.6 mg/dL — ABNORMAL LOW (ref 8.4–10.5)
GFR calc Af Amer: 52 mL/min — ABNORMAL LOW (ref >90)
GFR, EST NON AFRICAN AMERICAN: 45 mL/min — AB (ref >90)
GLUCOSE: 165 mg/dL — AB (ref 70–99)
Potassium: 3.7 mEq/L (ref 3.7–5.3)
Sodium: 159 mEq/L — ABNORMAL HIGH (ref 137–147)
Total Protein: 3.8 g/dL — ABNORMAL LOW (ref 6.0–8.3)

## 2014-03-04 LAB — CBC
HEMATOCRIT: 36.1 % — AB (ref 39.0–52.0)
Hemoglobin: 12 g/dL — ABNORMAL LOW (ref 13.0–17.0)
MCH: 29.9 pg (ref 26.0–34.0)
MCHC: 33.2 g/dL (ref 30.0–36.0)
MCV: 90 fL (ref 78.0–100.0)
Platelets: 77 10*3/uL — ABNORMAL LOW (ref 150–400)
RBC: 4.01 MIL/uL — AB (ref 4.22–5.81)
RDW: 14.8 % (ref 11.5–15.5)
WBC: 5.4 10*3/uL (ref 4.0–10.5)

## 2014-03-04 LAB — CARBOXYHEMOGLOBIN
Carboxyhemoglobin: 1.7 % — ABNORMAL HIGH (ref 0.5–1.5)
Methemoglobin: 1.2 % (ref 0.0–1.5)
O2 Saturation: 73.5 %
Total hemoglobin: 12.4 g/dL — ABNORMAL LOW (ref 13.5–18.0)

## 2014-03-04 LAB — CORTISOL: Cortisol, Plasma: 34.5 ug/dL

## 2014-03-04 LAB — PROCALCITONIN: Procalcitonin: 1.09 ng/mL

## 2014-03-04 LAB — HEPARIN LEVEL (UNFRACTIONATED)
HEPARIN UNFRACTIONATED: 0.56 [IU]/mL (ref 0.30–0.70)
Heparin Unfractionated: 0.57 IU/mL (ref 0.30–0.70)

## 2014-03-04 LAB — CK TOTAL AND CKMB (NOT AT ARMC)
CK, MB: 49.7 ng/mL (ref 0.3–4.0)
RELATIVE INDEX: 5.6 — AB (ref 0.0–2.5)
Total CK: 889 U/L — ABNORMAL HIGH (ref 7–232)

## 2014-03-04 LAB — TROPONIN I: Troponin I: >20 ng/mL (ref <0.30)

## 2014-03-04 LAB — POCT ACTIVATED CLOTTING TIME: Activated Clotting Time: 143 seconds

## 2014-03-04 LAB — MRSA PCR SCREENING: MRSA by PCR: NEGATIVE

## 2014-03-04 SURGERY — LEFT HEART CATHETERIZATION WITH CORONARY/GRAFT ANGIOGRAM
Anesthesia: LOCAL

## 2014-03-04 MED ORDER — SODIUM CHLORIDE 0.9 % IV BOLUS (SEPSIS): 500.0000 mL | Freq: Once | INTRAVENOUS | Status: DC

## 2014-03-04 MED ORDER — NITROGLYCERIN 0.2 MG/ML ON CALL CATH LAB
INTRAVENOUS | Status: AC
  Filled 2014-03-04: qty 1

## 2014-03-04 MED ORDER — SODIUM CHLORIDE 0.9 % IV SOLN
INTRAVENOUS | Status: DC
  Filled 2014-03-04: qty 1

## 2014-03-04 MED ORDER — SODIUM CHLORIDE 0.9 % IV SOLN
INTRAVENOUS | Status: DC
  Administered 2014-03-04: 2 [IU]/h via INTRAVENOUS
  Filled 2014-03-04: qty 1

## 2014-03-04 MED ORDER — INSULIN ASPART 100 UNIT/ML ~~LOC~~ SOLN
1.0000 [IU] | SUBCUTANEOUS | Status: DC
  Administered 2014-03-04: 3 [IU] via SUBCUTANEOUS

## 2014-03-04 MED ORDER — SODIUM CHLORIDE 0.9 % IV BOLUS (SEPSIS)
1000.0000 mL | Freq: Once | INTRAVENOUS | Status: AC
  Administered 2014-03-04: 1000 mL via INTRAVENOUS

## 2014-03-04 MED ORDER — LIDOCAINE HCL (PF) 1 % IJ SOLN
INTRAMUSCULAR | Status: AC
  Filled 2014-03-04: qty 30

## 2014-03-04 MED ORDER — BIOTENE DRY MOUTH MT LIQD
15.0000 mL | Freq: Two times a day (BID) | OROMUCOSAL | Status: DC
  Administered 2014-03-05 – 2014-03-15 (×16): 15 mL via OROMUCOSAL

## 2014-03-04 MED ORDER — DEXTROSE-NACL 5-0.45 % IV SOLN
INTRAVENOUS | Status: DC
  Administered 2014-03-04: 03:00:00 via INTRAVENOUS

## 2014-03-04 MED ORDER — HEPARIN (PORCINE) IN NACL 100-0.45 UNIT/ML-% IJ SOLN
900.0000 [IU]/h | INTRAMUSCULAR | Status: DC
  Administered 2014-03-04: 900 [IU]/h via INTRAVENOUS
  Filled 2014-03-04: qty 250

## 2014-03-04 MED ORDER — SODIUM CHLORIDE 0.9 % IJ SOLN: 3.0000 mL | Freq: Two times a day (BID) | INTRAMUSCULAR | Status: DC

## 2014-03-04 MED ORDER — SODIUM CHLORIDE 0.9 % IV SOLN: 250.0000 mL | INTRAVENOUS | Status: DC | PRN

## 2014-03-04 MED ORDER — SODIUM CHLORIDE 0.45 % IV SOLN
INTRAVENOUS | Status: DC
  Administered 2014-03-04 (×2): via INTRAVENOUS

## 2014-03-04 MED ORDER — ONDANSETRON HCL 4 MG/2ML IJ SOLN: 4.0000 mg | Freq: Four times a day (QID) | INTRAMUSCULAR | Status: DC | PRN

## 2014-03-04 MED ORDER — DEXTROSE 5 % IV SOLN
INTRAVENOUS | Status: DC
  Administered 2014-03-04 – 2014-03-06 (×2): via INTRAVENOUS
  Administered 2014-03-07: 50 mL via INTRAVENOUS
  Administered 2014-03-08: 05:00:00 via INTRAVENOUS

## 2014-03-04 MED ORDER — ASPIRIN 300 MG RE SUPP
300.0000 mg | Freq: Every day | RECTAL | Status: DC
  Administered 2014-03-05 – 2014-03-06 (×2): 300 mg via RECTAL
  Filled 2014-03-04 (×4): qty 1

## 2014-03-04 MED ORDER — LACTATED RINGERS IV BOLUS (SEPSIS)
500.0000 mL | INTRAVENOUS | Status: DC | PRN
  Administered 2014-03-04 (×6): 500 mL via INTRAVENOUS

## 2014-03-04 MED ORDER — SODIUM CHLORIDE 0.9 % IJ SOLN: 3.0000 mL | INTRAMUSCULAR | Status: DC | PRN

## 2014-03-04 MED ORDER — ACETAMINOPHEN 325 MG PO TABS: 650.0000 mg | ORAL_TABLET | ORAL | Status: DC | PRN

## 2014-03-04 MED ORDER — HEPARIN (PORCINE) IN NACL 2-0.9 UNIT/ML-% IJ SOLN
INTRAMUSCULAR | Status: AC
  Filled 2014-03-04: qty 1500

## 2014-03-04 MED ORDER — SODIUM CHLORIDE 0.9 % IV SOLN: INTRAVENOUS | Status: DC

## 2014-03-04 MED ORDER — CHLORHEXIDINE GLUCONATE 0.12 % MT SOLN
15.0000 mL | Freq: Two times a day (BID) | OROMUCOSAL | Status: DC
  Administered 2014-03-04 – 2014-03-13 (×17): 15 mL via OROMUCOSAL
  Filled 2014-03-04 (×25): qty 15

## 2014-03-04 MED ORDER — ACETAMINOPHEN 650 MG RE SUPP
650.0000 mg | Freq: Four times a day (QID) | RECTAL | Status: DC | PRN
  Administered 2014-03-04: 650 mg via RECTAL
  Filled 2014-03-04: qty 1

## 2014-03-04 MED ORDER — SODIUM CHLORIDE 0.9 % IV SOLN: 1.0000 mL/kg/h | INTRAVENOUS | Status: AC

## 2014-03-04 NOTE — Progress Notes (Signed)
INITIAL NUTRITION ASSESSMENT  DOCUMENTATION CODES Per approved criteria  -Not Applicable   INTERVENTION: - Advance diet as tolerated per MD to Carbohydrate Modified. RD will monitor intake and add supplements as needed.  - When patient more alert, may benefit from education. RD will monitor appropriateness.   NUTRITION DIAGNOSIS: Inadequate oral intake related to inability to eat as evidenced by NPO status.  Goal: Patient will meet >/=90% of estimated nutrition needs  Monitor:  Diet advancement, weight, labs, I/Os  Reason for Assessment: Low Braden  80 y.o. male  Admitting Dx: Acute respiratory failure, DKA  ASSESSMENT: 80 year old male with history of hypertension, diabetes, CAD, MI, found unresponsive. Found to have SIRS, sepsis, acute renal failure, DKA, acute encephalopathy, and acute NSTEMI. Patient at risk for aspiration and respiratory failure Patient lives by himself, and was fully functional prior to admission.  Multiple small pressure ulcers.  S/p left heart cath Patient is currently NPO, unable to determine intake prior to admission.   Height: Ht Readings from Last 1 Encounters:  03/03/14 5\' 9"  (1.753 m)    Weight: Wt Readings from Last 1 Encounters:  03/04/14 152 lb 5.4 oz (69.1 kg)    Ideal Body Weight: 160 pounds  % Ideal Body Weight: 95%  Wt Readings from Last 10 Encounters:  03/04/14 152 lb 5.4 oz (69.1 kg)  03/04/14 152 lb 5.4 oz (69.1 kg)    Usual Body Weight: Unknown  % Usual Body Weight:   BMI:  Body mass index is 22.49 kg/(m^2). Patient is normal weight.  Estimated Nutritional Needs: Kcal: 1650-1800 kcal Protein: 85-95 g Fluid: >2.1 L/day  Skin: Stage 1 pressure ulcers sacrum and heel, stage 2 pressure ulcers coccyx, hip, 1+ edema  Diet Order:    EDUCATION NEEDS: -Education not appropriate at this time   Intake/Output Summary (Last 24 hours) at 03/04/14 1522 Last data filed at 03/04/14 1453  Gross per 24 hour  Intake  8360.15 ml  Output   3110 ml  Net 5250.15 ml    Last BM: PTA   Labs:   Recent Labs Lab 03/03/14 1446 03/03/14 1629 03/03/14 2200 03/04/14 03/04/14 0500  NA 154* 154* 158* 159* 159*  K 5.3 5.3 4.1 4.0 3.7  CL 101 112 116* 119* 121*  CO2 9* 8* 15* 19 23  BUN 127* 104* 105* 102* 87*  CREATININE 2.01* 1.64* 1.68* 1.68* 1.41*  CALCIUM 9.3 7.9* 7.9* 7.9* 7.6*  MG  --  2.3  --   --   --   PHOS  --  4.0  --   --   --   GLUCOSE 640* 500* 413* 325* 165*    CBG (last 3)   Recent Labs  03/04/14 0714 03/04/14 0743 03/04/14 1321  GLUCAP 129* 108* 129*    Scheduled Meds: . aspirin  300 mg Rectal Daily  . insulin aspart  1-3 Units Subcutaneous 6 times per day  . pantoprazole (PROTONIX) IV  40 mg Intravenous Q24H  . piperacillin-tazobactam (ZOSYN)  IV  3.375 g Intravenous 3 times per day  . vancomycin  750 mg Intravenous Q24H    Continuous Infusions: . [START ON 03/05/2014] sodium chloride    . sodium chloride 1 mL/kg/hr (03/04/14 1414)  . dextrose 75 mL/hr at 03/04/14 0957  . heparin    . norepinephrine (LEVOPHED) Adult infusion 12 mcg/min (03/04/14 1158)    Past Medical History  Diagnosis Date  . DM (diabetes mellitus)   . HTN (hypertension)   . CAD (coronary  artery disease)   . MI (myocardial infarction)   . Dementia   . Arthritis   . Anxiety   . Hyperlipidemia     Past Surgical History  Procedure Laterality Date  . Coronary artery bypass graft    . Carotid endarterectomy      Linnell FullingElyse Lejon Afzal, RD, LDN Pager #: [phone] After-Hours Pager #: [phone]

## 2014-03-04 NOTE — Progress Notes (Signed)
  Echocardiogram 2D Echocardiogram has been performed.  [name] [name], [name] 03/04/2014, 11:32 AM

## 2014-03-04 NOTE — Progress Notes (Signed)
SUBJECTIVE:  Awake but does not communicate except for nodding head  OBJECTIVE:   Vitals:   Filed Vitals:   03/04/14 0500 03/04/14 0600 03/04/14 0700 03/04/14 0800  BP: 78/43 100/50 97/42 102/49  Pulse: 96 98 97 104  Temp: 96.6 F (35.9 C) 96.6 F (35.9 C) 96.8 F (36 C) 97.1 F (36.2 C)  TempSrc:  Core (Comment)    Resp: 22 19 21 16   Height:      Weight:  152 lb 5.4 oz (69.1 kg)    SpO2: 100% 100% 100% 99%   I&O's:   Intake/Output Summary (Last 24 hours) at 03/04/14 1040 Last data filed at 03/04/14 1000  Gross per 24 hour  Intake 8360.15 ml  Output   2710 ml  Net 5650.15 ml   TELEMETRY: Reviewed telemetry pt in NSR with PAC's:     PHYSICAL EXAM General: Well developed, ill appearing WM  Lungs:   Clear bilaterally to auscultation anteriorly  Heart:   HRRR S1 S2 Pulses are 2+ & equal. Occasional ectopy Abdomen: Bowel sounds are positive, abdomen soft and non-tender without masses Extremities:  1+ edema  LABS: Basic Metabolic Panel:  Recent Labs  47/82/95 1446 03/03/14 1629  03/04/14 03/04/14 0500  NA 154* 154*  < > 159* 159*  K 5.3 5.3  < > 4.0 3.7  CL 101 112  < > 119* 121*  CO2 9* 8*  < > 19 23  GLUCOSE 640* 500*  < > 325* 165*  BUN 127* 104*  < > 102* 87*  CREATININE 2.01* 1.64*  < > 1.68* 1.41*  CALCIUM 9.3 7.9*  < > 7.9* 7.6*  MG  --  2.3  --   --   --   PHOS  --  4.0  --   --   --   < > = values in this interval not displayed. Liver Function Tests:  Recent Labs  03/03/14 1446 03/04/14 0500  AST 16 51*  ALT 18 20  ALKPHOS 73 50  BILITOT 0.7 0.4  PROT 5.0* 3.8*  ALBUMIN 2.6* 1.8*   No results found for this basename: LIPASE, AMYLASE,  in the last 72 hours CBC:  Recent Labs  03/03/14 1446 03/04/14 0515  WBC 6.4 5.4  NEUTROABS 5.2  --   HGB 15.2 12.0*  HCT 47.7 36.1*  MCV 95.0 90.0  PLT 141* 77*   Cardiac Enzymes:  Recent Labs  03/03/14 1446 03/03/14 1629 03/03/14 2221 03/04/14 0500  CKTOTAL 249*  --   --  889*  CKMB  --    --   --  49.7*  TROPONINI 0.42* 1.71* 11.22* >20.00*   BNP: No components found with this basename: POCBNP,  D-Dimer: No results found for this basename: DDIMER,  in the last 72 hours Hemoglobin A1C: No results found for this basename: HGBA1C,  in the last 72 hours Fasting Lipid Panel: No results found for this basename: CHOL, HDL, LDLCALC, TRIG, CHOLHDL, LDLDIRECT,  in the last 72 hours Thyroid Function Tests: No results found for this basename: TSH, T4TOTAL, FREET3, T3FREE, THYROIDAB,  in the last 72 hours Anemia Panel: No results found for this basename: VITAMINB12, FOLATE, FERRITIN, TIBC, IRON, RETICCTPCT,  in the last 72 hours Coag Panel:   Lab Results  Component Value Date   INR 1.05 03/03/2014    RADIOLOGY: Ct Head Wo Contrast  03/03/2014   CLINICAL DATA:  Confused, unresponsive  EXAM: CT HEAD WITHOUT CONTRAST  CT CERVICAL SPINE WITHOUT CONTRAST  TECHNIQUE: Multidetector CT imaging of the head and cervical spine was performed following the standard protocol without intravenous contrast. Multiplanar CT image reconstructions of the cervical spine were also generated.  COMPARISON:  None.  FINDINGS: CT HEAD FINDINGS  There is no evidence of mass effect, midline shift, or extra-axial fluid collections. There is no evidence of a space-occupying lesion or intracranial hemorrhage. There is no evidence of a cortical-based area of acute infarction. There is generalized cerebral atrophy. There is periventricular white matter low attenuation likely secondary to microangiopathy.  The ventricles and sulci are appropriate for the patient's age. The basal cisterns are patent.  Visualized portions of the orbits are unremarkable. The visualized portions of the paranasal sinuses and mastoid air cells are unremarkable. Cerebrovascular atherosclerotic calcifications are noted.  The osseous structures are unremarkable.  CT CERVICAL SPINE FINDINGS  The alignment is anatomic. The vertebral body heights are  maintained. There is no acute fracture. There is no static listhesis. The prevertebral soft tissues are normal. The intraspinal soft tissues are not fully imaged on this examination due to poor soft tissue contrast, but there is no gross soft tissue abnormality.  There is degenerative disc disease throughout the cervical spine. There broad-based disc bulges at C2-3, C3-4, C4-5, C5-6 and C6-7. There is bilateral facet arthropathy extending from C3-4 through C7-T1. There is bilateral foraminal stenosis at C5-6 and C6-7. Marland Kitchen There is unilateral right foraminal stenosis at C3-4 and C4-5.  There is left carotid artery atherosclerosis. There is evidence of prior right carotid endarterectomy.  IMPRESSION: 1. No acute intracranial pathology. 2. No acute osseous injury of the cervical spine. 3. Diffuse cervical spine spondylosis as described above.   Electronically Signed   By: Elige Ko   On: 03/03/2014 16:00   Ct Cervical Spine Wo Contrast  03/03/2014   CLINICAL DATA:  Confused, unresponsive  EXAM: CT HEAD WITHOUT CONTRAST  CT CERVICAL SPINE WITHOUT CONTRAST  TECHNIQUE: Multidetector CT imaging of the head and cervical spine was performed following the standard protocol without intravenous contrast. Multiplanar CT image reconstructions of the cervical spine were also generated.  COMPARISON:  None.  FINDINGS: CT HEAD FINDINGS  There is no evidence of mass effect, midline shift, or extra-axial fluid collections. There is no evidence of a space-occupying lesion or intracranial hemorrhage. There is no evidence of a cortical-based area of acute infarction. There is generalized cerebral atrophy. There is periventricular white matter low attenuation likely secondary to microangiopathy.  The ventricles and sulci are appropriate for the patient's age. The basal cisterns are patent.  Visualized portions of the orbits are unremarkable. The visualized portions of the paranasal sinuses and mastoid air cells are unremarkable.  Cerebrovascular atherosclerotic calcifications are noted.  The osseous structures are unremarkable.  CT CERVICAL SPINE FINDINGS  The alignment is anatomic. The vertebral body heights are maintained. There is no acute fracture. There is no static listhesis. The prevertebral soft tissues are normal. The intraspinal soft tissues are not fully imaged on this examination due to poor soft tissue contrast, but there is no gross soft tissue abnormality.  There is degenerative disc disease throughout the cervical spine. There broad-based disc bulges at C2-3, C3-4, C4-5, C5-6 and C6-7. There is bilateral facet arthropathy extending from C3-4 through C7-T1. There is bilateral foraminal stenosis at C5-6 and C6-7. Marland Kitchen There is unilateral right foraminal stenosis at C3-4 and C4-5.  There is left carotid artery atherosclerosis. There is evidence of prior right carotid endarterectomy.  IMPRESSION: 1. No acute intracranial  pathology. 2. No acute osseous injury of the cervical spine. 3. Diffuse cervical spine spondylosis as described above.   Electronically Signed   By: Elige Ko   On: 03/03/2014 16:00   Dg Chest Port 1 View  03/04/2014   CLINICAL DATA:  Hypoxia.  EXAM: PORTABLE CHEST - 1 VIEW  COMPARISON:  03/03/2014  FINDINGS: The left subclavian central line still extends along the left side of the heart and mediastinum. Similar to the previous chest radiograph reports, this could be within a persistent left SVC but cannot exclude intra-arterial placement. No evidence for a pneumothorax. Stable appearance of the heart and mediastinum with median sternotomy wires. Lungs are clear without airspace disease or pulmonary edema.  IMPRESSION: Abnormal position of the left subclavian central line as described.  No focal chest disease.   Electronically Signed   By: Richarda Overlie M.D.   On: 03/04/2014 08:16   Dg Chest Portable 1 View  03/03/2014   CLINICAL DATA:  Altered mental status.  EXAM: PORTABLE CHEST - 1 VIEW  COMPARISON:  March 03, 2014.  FINDINGS: Stable cardiomediastinal silhouette. Status post coronary artery bypass graft. No pneumothorax or pleural effusion is noted. No acute pulmonary disease is noted. Left internal jugular catheter has been removed and replaced with left subclavian catheter; this catheter also projects over the left side of the mediastinum. This either represents intra-arterial position or congenital venous variant such as left-sided superior vena cava. Bony thorax is intact.  IMPRESSION: No acute cardiopulmonary abnormality seen. Left internal jugular catheter has been removed and replaced with left subclavian catheter, which also demonstrates left-sided position over mediastinum. As described above, this represents either intra-arterial position or congenital venous variant such as left-sided superior vena cava.   Electronically Signed   By: Roque Lias M.D.   On: 03/03/2014 18:39   Dg Chest Port 1 View  03/03/2014   CLINICAL DATA:  Status post line placement.  EXAM: PORTABLE CHEST - 1 VIEW  COMPARISON:  None.  FINDINGS: There is a left internal jugular central venous catheter with the tip projecting over the left aspect of the mediastinum. Patient is rotated, limiting evaluation. Grossly unremarkable mediastinal contours given the patient rotation. Patient status post median sternotomy and CABG procedure. The lungs are well aerated. No large consolidative pulmonary opacities. No pleural effusion or pneumothorax. The left costophrenic angle is excluded from view. Regional skeleton is unremarkable.  IMPRESSION:AAAA: IMPRESSION:AAAA Left internal jugular central venous catheter tip projects over the left aspect of the mediastinum. This is concerning for intra-arterial line placement in the absence of known variant anatomy such as left-sided superior vena cava. Recommend clinical correlation, potentially with arterial blood gas. Additionally followup chest radiograph with repositioning is recommended.  Critical  Value/emergent results were called by telephone at the time of interpretation on 03/03/2014 at 5:34 PM to Dr. Marin Shutter, who verbally acknowledged these results.   Electronically Signed   By: Annia Belt M.D.   On: 03/03/2014 17:44   ASSESSMENT/PLAN:  1.  Acute myocardial infarction - non-ST elevation now with troponin > 20 and CPK MB consistent with MI and not rhabdo.  2D echo prelim done which I personally reviewed revealed preserved LVF with ? Basal inferoseptal and basal inferior HK otherwise no RWMA's.  He is still requiring pressor support to maintain adequate BP.  I have reviewed the case with Dr. Excell Seltzer and feel that he should go to cath.  I have spoken with his granddaughter and explained his condition and the  need to proceed with cath.  We discussed the risks of acute nephrotoxicity from IV contrast on chronic renal insufficiency and potential threat of acute renal failure and need for HD post op especially since he will need more contrast with grafts.  His granddaughter says that he was fully functional before this, driving himself to [employer] each morning and lived alone.  His daughter is ill with stage 4 Gliobastoma and is going into a nursing home.  I tried contacting her but could not reach her. - ASA/IV Heparin gtt - statin when taking PO - no beta blocker due to hypotension - emergent cath to evaluate coronary anatomy and grafts 2.  ASCAD with remote MI and CABG 3.  Shock felt initially to be due to hypovolemic shock but still hypotensive despite 5L fluid resuscitation - continue Levophed 4.  DM 5.  Dementia but lives alone and fully functional per granddaughter and brother 136.  Dyslipidemia         Quintella ReichertURNER,TRACI R, MD  03/04/2014  10:40 AM

## 2014-03-04 NOTE — Progress Notes (Signed)
PULMONARY / CRITICAL CARE MEDICINE   Name: [name] [name] MRN: [id number] DOB: 06-10-1929    ADMISSION DATE:  03/03/2014  REFERRING MD :  EDP PRIMARY SERVICE: PCCM  CHIEF COMPLAINT:  Found down  BRIEF PATIENT DESCRIPTION:    80 yo male with hx HTN, DM, CAD, MI presented 3/12 after being found unresponsive.  Pt last seen ~5 days prior to admit by family at which time he was reportedly confused, not himself.  Neighbors called EMS r/t multiple newspapers outside.  Unresponsive, hypotensive, hypothermic on EMS arrival, found in urine and fesces with multiple pressure ulcers.  ?DKA. BP 80's in ER despite 3L fluids, hypothermic with temp 91.     LINES / TUBES: L IJ CVL 3/12 >>> Foley 3/12 >>>  CULTURES: 3/12  Blood >>> 3/12  Urine >>>  ANTIBIOTICS: Vancomycin 3/12>>> Zosyn 3/12 >>>   SIGNIFICANT EVENTS / STUDIES:  3/12  Head / c-spine CT >>> nad    SUBJECTIVE/OVERNIGHT/INTERVAL HX   03/04/14: Not intubated. On levophed. Drowsy, ? Cheyne stokes breathing but did follow commands and moved all 4s to RN.  Metabolic acidosis on aBG. Trop > 20 ; on IV heparin. Cards thinking of swan. Echo pending   VITAL SIGNS: Temp:  [91.1 F (32.8 C)-97.8 F (36.6 C)] 97.1 F (36.2 C) (03/13 0800) Pulse Rate:  [56-163] 104 (03/13 0800) Resp:  [16-27] 16 (03/13 0800) BP: (77-125)/(26-92) 102/49 mmHg (03/13 0800) SpO2:  [88 %-100 %] 99 % (03/13 0800) Weight:  [64.5 kg (142 lb 3.2 oz)-69.1 kg (152 lb 5.4 oz)] 69.1 kg (152 lb 5.4 oz) (03/13 0600)  HEMODYNAMICS: CVP:  [0 mmHg-4 mmHg] 0 mmHg VENTILATOR SETTINGS:   INTAKE / OUTPUT: Intake/Output     03/12 0701 - 03/13 0700 03/13 0701 - 03/14 0700   I.V. (mL/kg) 2979.5 (43.1) 280.7 (4.1)   Other 5000    IV Piggyback 87.5 12.5   Total Intake(mL/kg) 8067 (116.7) 293.2 (4.2)   Urine (mL/kg/hr) 2030 560 (3.2)   Total Output 2030 560   Net +6037 -266.8          PHYSICAL EXAMINATION: General:  Frail, thin, chronically ill appearing  male, dirty, disheveled  Neuro:  Lethargic RASS -3 equivalent. Small bruise to left fronto-temp area HEENT:  Mm dry, pale, no JVD, cervical collar Cardiovascular:  s1s2 rrr, sternotomy scar  Lungs:  RR 15. ? Hypoventilating ? Cheyne stokes Abdomen:  Soft, nt, +bs Musculoskeletal:  Pale, multiple small pressure ulcers, no edema   LABS:  PULMONARY  Recent Labs Lab 03/03/14 1759 03/03/14 1958 03/03/14 2038  PHART 7.047*  --  7.183*  PCO2ART 31.4*  --  34.7*  PO2ART 84.0  --  451.0*  HCO3 8.6* 11.4* 13.0*  TCO2 10 12 14   O2SAT 90.0 79.0 100.0    CBC  Recent Labs Lab 03/03/14 1446 03/04/14 0515  HGB 15.2 12.0*  HCT 47.7 36.1*  WBC 6.4 5.4  PLT 141* 77*    COAGULATION  Recent Labs Lab 03/03/14 1446  INR 1.05    CARDIAC   Recent Labs Lab 03/03/14 1446 03/03/14 1629 03/03/14 2221 03/04/14 0500  TROPONINI 0.42* 1.71* 11.22* >20.00*   No results found for this basename: PROBNP,  in the last 168 hours   CHEMISTRY  Recent Labs Lab 03/03/14 1446 03/03/14 1629 03/03/14 2200 03/04/14 03/04/14 0500  NA 154* 154* 158* 159* 159*  K 5.3 5.3 4.1 4.0 3.7  CL 101 112 116* 119* 121*  CO2 9* 8* 15* 19 23  GLUCOSE 640* 500* 413* 325* 165*  BUN 127* 104* 105* 102* 87*  CREATININE 2.01* 1.64* 1.68* 1.68* 1.41*  CALCIUM 9.3 7.9* 7.9* 7.9* 7.6*  MG  --  2.3  --   --   --   PHOS  --  4.0  --   --   --    Estimated Creatinine Clearance: 39.5 ml/min (by C-G formula based on Cr of 1.41).   LIVER  Recent Labs Lab 03/03/14 1446 03/04/14 0500  AST 16 51*  ALT 18 20  ALKPHOS 73 50  BILITOT 0.7 0.4  PROT 5.0* 3.8*  ALBUMIN 2.6* 1.8*  INR 1.05  --      INFECTIOUS  Recent Labs Lab 03/03/14 1447 03/03/14 1629  LATICACIDVEN 3.4* 1.8   No results found for this basename: PROCALCITON,  in the last 168 hours   ENDOCRINE CBG (last 3)   Recent Labs  03/04/14 0605 03/04/14 0714 03/04/14 0743  GLUCAP 127* 129* 108*         IMAGING x48h  Ct  Head Wo Contrast  03/03/2014   CLINICAL DATA:  Confused, unresponsive  EXAM: CT HEAD WITHOUT CONTRAST  CT CERVICAL SPINE WITHOUT CONTRAST  TECHNIQUE: Multidetector CT imaging of the head and cervical spine was performed following the standard protocol without intravenous contrast. Multiplanar CT image reconstructions of the cervical spine were also generated.  COMPARISON:  None.  FINDINGS: CT HEAD FINDINGS  There is no evidence of mass effect, midline shift, or extra-axial fluid collections. There is no evidence of a space-occupying lesion or intracranial hemorrhage. There is no evidence of a cortical-based area of acute infarction. There is generalized cerebral atrophy. There is periventricular white matter low attenuation likely secondary to microangiopathy.  The ventricles and sulci are appropriate for the patient's age. The basal cisterns are patent.  Visualized portions of the orbits are unremarkable. The visualized portions of the paranasal sinuses and mastoid air cells are unremarkable. Cerebrovascular atherosclerotic calcifications are noted.  The osseous structures are unremarkable.  CT CERVICAL SPINE FINDINGS  The alignment is anatomic. The vertebral body heights are maintained. There is no acute fracture. There is no static listhesis. The prevertebral soft tissues are normal. The intraspinal soft tissues are not fully imaged on this examination due to poor soft tissue contrast, but there is no gross soft tissue abnormality.  There is degenerative disc disease throughout the cervical spine. There broad-based disc bulges at C2-3, C3-4, C4-5, C5-6 and C6-7. There is bilateral facet arthropathy extending from C3-4 through C7-T1. There is bilateral foraminal stenosis at C5-6 and C6-7. Marland Kitchen. There is unilateral right foraminal stenosis at C3-4 and C4-5.  There is left carotid artery atherosclerosis. There is evidence of prior right carotid endarterectomy.  IMPRESSION: 1. No acute intracranial pathology. 2. No  acute osseous injury of the cervical spine. 3. Diffuse cervical spine spondylosis as described above.   Electronically Signed   By: Elige KoHetal  Patel   On: 03/03/2014 16:00   Ct Cervical Spine Wo Contrast  03/03/2014   CLINICAL DATA:  Confused, unresponsive  EXAM: CT HEAD WITHOUT CONTRAST  CT CERVICAL SPINE WITHOUT CONTRAST  TECHNIQUE: Multidetector CT imaging of the head and cervical spine was performed following the standard protocol without intravenous contrast. Multiplanar CT image reconstructions of the cervical spine were also generated.  COMPARISON:  None.  FINDINGS: CT HEAD FINDINGS  There is no evidence of mass effect, midline shift, or extra-axial fluid collections. There is no evidence of a space-occupying lesion or intracranial hemorrhage.  There is no evidence of a cortical-based area of acute infarction. There is generalized cerebral atrophy. There is periventricular white matter low attenuation likely secondary to microangiopathy.  The ventricles and sulci are appropriate for the patient's age. The basal cisterns are patent.  Visualized portions of the orbits are unremarkable. The visualized portions of the paranasal sinuses and mastoid air cells are unremarkable. Cerebrovascular atherosclerotic calcifications are noted.  The osseous structures are unremarkable.  CT CERVICAL SPINE FINDINGS  The alignment is anatomic. The vertebral body heights are maintained. There is no acute fracture. There is no static listhesis. The prevertebral soft tissues are normal. The intraspinal soft tissues are not fully imaged on this examination due to poor soft tissue contrast, but there is no gross soft tissue abnormality.  There is degenerative disc disease throughout the cervical spine. There broad-based disc bulges at C2-3, C3-4, C4-5, C5-6 and C6-7. There is bilateral facet arthropathy extending from C3-4 through C7-T1. There is bilateral foraminal stenosis at C5-6 and C6-7. Marland Kitchen There is unilateral right foraminal  stenosis at C3-4 and C4-5.  There is left carotid artery atherosclerosis. There is evidence of prior right carotid endarterectomy.  IMPRESSION: 1. No acute intracranial pathology. 2. No acute osseous injury of the cervical spine. 3. Diffuse cervical spine spondylosis as described above.   Electronically Signed   By: Elige Ko   On: 03/03/2014 16:00   Dg Chest Port 1 View  03/04/2014   CLINICAL DATA:  Hypoxia.  EXAM: PORTABLE CHEST - 1 VIEW  COMPARISON:  03/03/2014  FINDINGS: The left subclavian central line still extends along the left side of the heart and mediastinum. Similar to the previous chest radiograph reports, this could be within a persistent left SVC but cannot exclude intra-arterial placement. No evidence for a pneumothorax. Stable appearance of the heart and mediastinum with median sternotomy wires. Lungs are clear without airspace disease or pulmonary edema.  IMPRESSION: Abnormal position of the left subclavian central line as described.  No focal chest disease.   Electronically Signed   By: Richarda Overlie M.D.   On: 03/04/2014 08:16   Dg Chest Portable 1 View  03/03/2014   CLINICAL DATA:  Altered mental status.  EXAM: PORTABLE CHEST - 1 VIEW  COMPARISON:  March 03, 2014.  FINDINGS: Stable cardiomediastinal silhouette. Status post coronary artery bypass graft. No pneumothorax or pleural effusion is noted. No acute pulmonary disease is noted. Left internal jugular catheter has been removed and replaced with left subclavian catheter; this catheter also projects over the left side of the mediastinum. This either represents intra-arterial position or congenital venous variant such as left-sided superior vena cava. Bony thorax is intact.  IMPRESSION: No acute cardiopulmonary abnormality seen. Left internal jugular catheter has been removed and replaced with left subclavian catheter, which also demonstrates left-sided position over mediastinum. As described above, this represents either intra-arterial  position or congenital venous variant such as left-sided superior vena cava.   Electronically Signed   By: Roque Lias M.D.   On: 03/03/2014 18:39   Dg Chest Port 1 View  03/03/2014   CLINICAL DATA:  Status post line placement.  EXAM: PORTABLE CHEST - 1 VIEW  COMPARISON:  None.  FINDINGS: There is a left internal jugular central venous catheter with the tip projecting over the left aspect of the mediastinum. Patient is rotated, limiting evaluation. Grossly unremarkable mediastinal contours given the patient rotation. Patient status post median sternotomy and CABG procedure. The lungs are well aerated. No large consolidative pulmonary  opacities. No pleural effusion or pneumothorax. The left costophrenic angle is excluded from view. Regional skeleton is unremarkable.  IMPRESSION:AAAA: IMPRESSION:AAAA Left internal jugular central venous catheter tip projects over the left aspect of the mediastinum. This is concerning for intra-arterial line placement in the absence of known variant anatomy such as left-sided superior vena cava. Recommend clinical correlation, potentially with arterial blood gas. Additionally followup chest radiograph with repositioning is recommended.  Critical Value/emergent results were called by telephone at the time of interpretation on 03/03/2014 at 5:34 PM to Dr. Marin Shutter, who verbally acknowledged these results.   Electronically Signed   By: Annia Belt M.D.   On: 03/03/2014 17:44      ASSESSMENT / PLAN:  PULMONARY A: Protects airway at this time At risk for aspiration At risk for respiratory failure due to high metabolic demand   - more lethargic P:   Check abg to decide on intubation Goal SpO2>92 Supplemental oxygen BiPAP contraindicated   CARDIOVASCULAR A: SIRS / Sepsis Hx HTN, CAD, MI, s/p CABG   - Definite MI. Echo pending. Cards following. On IV heparin. Shock could be septic  P:  Goal MAP>65 Levophed gtt r  Check stat scvo2 Rest per cards:  ? They  are planning swan Echo result awaited  RENAL A: Acute renal failure  Possible rhabdomyolysis Hypovolemia Hypernatremia  Gap metabolic acidosis acidosis   - Improved creat but CK 889 post admission. Na high  P:   Change fluuids to D5 water; repea bmet at 4pm to ensure no over correction Goal CVP 8-10 Trend BMP  GASTROINTESTINAL A: Nutrition GIPx P:   NPO  Protonix  HEMATOLOGIC A: Thrombocytopenia VTE Px P:  Trend CBC Heparin to continue for now but monitor platelets  INFECTIOUS A: Suspected sepsis No clear infectious source P:   Cx / abx as above PCT  ENDOCRINE A: DM  DKA  Unknown adrenal function: cortisol 34.5 P:   ICU hyperglyucemia protocol (dc DKA protocol) No role for steroids  NEUROLOGIC A: Acute encephalopathy likely metabolic  P:   C-collar for now Monitor   The patient is critically ill with multiple organ systems failure and requires high complexity decision making for assessment and support, frequent evaluation and titration of therapies, application of advanced monitoring technologies and extensive interpretation of multiple databases.   Critical Care Time devoted to patient care services described in this note is  45  Minutes.  Dr. Kalman Shan, M.D., [facility].C.P Pulmonary and Critical Care Medicine Staff Physician [facility] [place] [facility] Pager: [phone], If no answer or between  15:00h - 7:00h: call [phone]  03/04/2014 9:52 AM

## 2014-03-04 NOTE — Progress Notes (Signed)
eLink Physician-Brief Progress Note Patient Name: [name] GustRobert [name] DOB: 07/18/1929 MRN: [id number]  Date of Service  03/04/2014   HPI/Events of Note  Severe free water deficit   eICU Interventions  NS changed to 1/2 NS @ 250 cc/hr   Intervention Category Major Interventions: Electrolyte abnormality - evaluation and management  Billy FischerDavid Simonds 03/04/2014, 1:07 AM

## 2014-03-04 NOTE — Interval H&P Note (Signed)
History and Physical Interval Note:  03/04/2014 12:25 PM  [name] GustRobert [name]  has presented today for surgery, with the diagnosis of cp  The various methods of treatment have been discussed with the patient and family. After consideration of risks, benefits and other options for treatment, the patient has consented to  Procedure(s): LEFT HEART CATHETERIZATION WITH CORONARY/GRAFT ANGIOGRAM (N/A) as a surgical intervention .  The patient's history has been reviewed, patient examined, no change in status, stable for surgery.  I have reviewed the patient's chart and labs.  Questions were answered to the patient's satisfaction.    Cath Lab Visit (complete for each Cath Lab visit)  Clinical Evaluation Leading to the Procedure:   ACS: yes  Non-ACS:    Anginal Classification: CCS IV  Anti-ischemic medical therapy: No Therapy  Non-Invasive Test Results: No non-invasive testing performed  Prior CABG: Previous CABG         Tonny BollmanMichael Mele Sylvester

## 2014-03-04 NOTE — Progress Notes (Addendum)
RN called pt has rec'd another 5 L of fluid and BP low, continue levophed may need to increase.  Will check echo. May need swan to allow for more fluid.

## 2014-03-04 NOTE — Progress Notes (Signed)
ANTICOAGULATION CONSULT NOTE - Follow Up Consult  Pharmacy Consult for Heparin restart post cath Indication: Severe 3 vessel CAD w/ total occlusion of LM and RCA  No Known Allergies Patient Measurements: Height: 5\' 9"  (175.3 cm) Weight: 152 lb 5.4 oz (69.1 kg) IBW/kg (Calculated) : 70.7 Heparin Dosing Weight: 64.5 kg Vital Signs: Temp: 97.9 F (36.6 C) (03/13 1100) Temp src: Core (Comment) (03/13 0600) BP: 116/51 mmHg (03/13 1100) Pulse Rate: 96 (03/13 1233) Labs:  Recent Labs  03/03/14 1446 03/03/14 1629 03/03/14 2200 03/03/14 2221 03/04/14 03/04/14 0500 03/04/14 0515 03/04/14 1056  HGB 15.2  --   --   --   --   --  12.0*  --   HCT 47.7  --   --   --   --   --  36.1*  --   PLT 141*  --   --   --   --   --  77*  --   LABPROT 13.5  --   --   --   --   --   --   --   INR 1.05  --   --   --   --   --   --   --   HEPARINUNFRC  --   --   --   --   --   --  0.57 0.56  CREATININE 2.01* 1.64* 1.68*  --  1.68* 1.41*  --   --   CKTOTAL 249*  --   --   --   --  889*  --   --   CKMB  --   --   --   --   --  49.7*  --   --   TROPONINI 0.42* 1.71*  --  11.22*  --  >20.00*  --   --     Estimated Creatinine Clearance: 39.5 ml/min (by C-G formula based on Cr of 1.41).  Medications:  Heparin held for cath  Assessment: [age] YOM s/p cath for elevated troponin found to have severe 3 vessel CAD but patent free RIMA-LAD, LIMA-diagonal, and SVG-PDA. Plan to continue heparin and medical therapy for type 2 NSTEMI. Heparin was therapeutic x2 levels on 900 units/hr prior to cath. To resume heparin 8 hours post-sheath pull. H/H/plts trending down but this could be dilutional. No bleeding noted. Cath was femoral site - sheath removed at 14:00PM.   Goal of Therapy:  Heparin level 0.3-0.7 units/ml Monitor platelets by anticoagulation protocol: Yes   Plan:  1. Restart IV heparin at 900 units/hr at 2200PM.  2. Check heparin level post-restart (ok to do with AM labs).  3. Daily heparin level and  CBC while on therapy. 4. Follow-up duration of therapy with cardiology.   Link SnufferJessica Monseratt Ledin, PharmD, BCPS Clinical Pharmacist [phone] 03/04/2014,1:18 PM

## 2014-03-04 NOTE — H&P (View-Only) (Signed)
SUBJECTIVE:  Awake but does not communicate except for nodding head  OBJECTIVE:   Vitals:   Filed Vitals:   03/04/14 0500 03/04/14 0600 03/04/14 0700 03/04/14 0800  BP: 78/43 100/50 97/42 102/49  Pulse: 96 98 97 104  Temp: 96.6 F (35.9 C) 96.6 F (35.9 C) 96.8 F (36 C) 97.1 F (36.2 C)  TempSrc:  Core (Comment)    Resp: 22 19 21 16   Height:      Weight:  152 lb 5.4 oz (69.1 kg)    SpO2: 100% 100% 100% 99%   I&O's:   Intake/Output Summary (Last 24 hours) at 03/04/14 1040 Last data filed at 03/04/14 1000  Gross per 24 hour  Intake 8360.15 ml  Output   2710 ml  Net 5650.15 ml   TELEMETRY: Reviewed telemetry pt in NSR with PAC's:     PHYSICAL EXAM General: Well developed, ill appearing WM  Lungs:   Clear bilaterally to auscultation anteriorly  Heart:   HRRR S1 S2 Pulses are 2+ & equal. Occasional ectopy Abdomen: Bowel sounds are positive, abdomen soft and non-tender without masses Extremities:  1+ edema  LABS: Basic Metabolic Panel:  Recent Labs  47/82/95 1446 03/03/14 1629  03/04/14 03/04/14 0500  NA 154* 154*  < > 159* 159*  K 5.3 5.3  < > 4.0 3.7  CL 101 112  < > 119* 121*  CO2 9* 8*  < > 19 23  GLUCOSE 640* 500*  < > 325* 165*  BUN 127* 104*  < > 102* 87*  CREATININE 2.01* 1.64*  < > 1.68* 1.41*  CALCIUM 9.3 7.9*  < > 7.9* 7.6*  MG  --  2.3  --   --   --   PHOS  --  4.0  --   --   --   < > = values in this interval not displayed. Liver Function Tests:  Recent Labs  03/03/14 1446 03/04/14 0500  AST 16 51*  ALT 18 20  ALKPHOS 73 50  BILITOT 0.7 0.4  PROT 5.0* 3.8*  ALBUMIN 2.6* 1.8*   No results found for this basename: LIPASE, AMYLASE,  in the last 72 hours CBC:  Recent Labs  03/03/14 1446 03/04/14 0515  WBC 6.4 5.4  NEUTROABS 5.2  --   HGB 15.2 12.0*  HCT 47.7 36.1*  MCV 95.0 90.0  PLT 141* 77*   Cardiac Enzymes:  Recent Labs  03/03/14 1446 03/03/14 1629 03/03/14 2221 03/04/14 0500  CKTOTAL 249*  --   --  889*  CKMB  --    --   --  49.7*  TROPONINI 0.42* 1.71* 11.22* >20.00*   BNP: No components found with this basename: POCBNP,  D-Dimer: No results found for this basename: DDIMER,  in the last 72 hours Hemoglobin A1C: No results found for this basename: HGBA1C,  in the last 72 hours Fasting Lipid Panel: No results found for this basename: CHOL, HDL, LDLCALC, TRIG, CHOLHDL, LDLDIRECT,  in the last 72 hours Thyroid Function Tests: No results found for this basename: TSH, T4TOTAL, FREET3, T3FREE, THYROIDAB,  in the last 72 hours Anemia Panel: No results found for this basename: VITAMINB12, FOLATE, FERRITIN, TIBC, IRON, RETICCTPCT,  in the last 72 hours Coag Panel:   Lab Results  Component Value Date   INR 1.05 03/03/2014    RADIOLOGY: Ct Head Wo Contrast  03/03/2014   CLINICAL DATA:  Confused, unresponsive  EXAM: CT HEAD WITHOUT CONTRAST  CT CERVICAL SPINE WITHOUT CONTRAST  TECHNIQUE: Multidetector CT imaging of the head and cervical spine was performed following the standard protocol without intravenous contrast. Multiplanar CT image reconstructions of the cervical spine were also generated.  COMPARISON:  None.  FINDINGS: CT HEAD FINDINGS  There is no evidence of mass effect, midline shift, or extra-axial fluid collections. There is no evidence of a space-occupying lesion or intracranial hemorrhage. There is no evidence of a cortical-based area of acute infarction. There is generalized cerebral atrophy. There is periventricular white matter low attenuation likely secondary to microangiopathy.  The ventricles and sulci are appropriate for the patient's age. The basal cisterns are patent.  Visualized portions of the orbits are unremarkable. The visualized portions of the paranasal sinuses and mastoid air cells are unremarkable. Cerebrovascular atherosclerotic calcifications are noted.  The osseous structures are unremarkable.  CT CERVICAL SPINE FINDINGS  The alignment is anatomic. The vertebral body heights are  maintained. There is no acute fracture. There is no static listhesis. The prevertebral soft tissues are normal. The intraspinal soft tissues are not fully imaged on this examination due to poor soft tissue contrast, but there is no gross soft tissue abnormality.  There is degenerative disc disease throughout the cervical spine. There broad-based disc bulges at C2-3, C3-4, C4-5, C5-6 and C6-7. There is bilateral facet arthropathy extending from C3-4 through C7-T1. There is bilateral foraminal stenosis at C5-6 and C6-7. Marland Kitchen There is unilateral right foraminal stenosis at C3-4 and C4-5.  There is left carotid artery atherosclerosis. There is evidence of prior right carotid endarterectomy.  IMPRESSION: 1. No acute intracranial pathology. 2. No acute osseous injury of the cervical spine. 3. Diffuse cervical spine spondylosis as described above.   Electronically Signed   By: Elige Ko   On: 03/03/2014 16:00   Ct Cervical Spine Wo Contrast  03/03/2014   CLINICAL DATA:  Confused, unresponsive  EXAM: CT HEAD WITHOUT CONTRAST  CT CERVICAL SPINE WITHOUT CONTRAST  TECHNIQUE: Multidetector CT imaging of the head and cervical spine was performed following the standard protocol without intravenous contrast. Multiplanar CT image reconstructions of the cervical spine were also generated.  COMPARISON:  None.  FINDINGS: CT HEAD FINDINGS  There is no evidence of mass effect, midline shift, or extra-axial fluid collections. There is no evidence of a space-occupying lesion or intracranial hemorrhage. There is no evidence of a cortical-based area of acute infarction. There is generalized cerebral atrophy. There is periventricular white matter low attenuation likely secondary to microangiopathy.  The ventricles and sulci are appropriate for the patient's age. The basal cisterns are patent.  Visualized portions of the orbits are unremarkable. The visualized portions of the paranasal sinuses and mastoid air cells are unremarkable.  Cerebrovascular atherosclerotic calcifications are noted.  The osseous structures are unremarkable.  CT CERVICAL SPINE FINDINGS  The alignment is anatomic. The vertebral body heights are maintained. There is no acute fracture. There is no static listhesis. The prevertebral soft tissues are normal. The intraspinal soft tissues are not fully imaged on this examination due to poor soft tissue contrast, but there is no gross soft tissue abnormality.  There is degenerative disc disease throughout the cervical spine. There broad-based disc bulges at C2-3, C3-4, C4-5, C5-6 and C6-7. There is bilateral facet arthropathy extending from C3-4 through C7-T1. There is bilateral foraminal stenosis at C5-6 and C6-7. Marland Kitchen There is unilateral right foraminal stenosis at C3-4 and C4-5.  There is left carotid artery atherosclerosis. There is evidence of prior right carotid endarterectomy.  IMPRESSION: 1. No acute intracranial  pathology. 2. No acute osseous injury of the cervical spine. 3. Diffuse cervical spine spondylosis as described above.   Electronically Signed   By: Elige Ko   On: 03/03/2014 16:00   Dg Chest Port 1 View  03/04/2014   CLINICAL DATA:  Hypoxia.  EXAM: PORTABLE CHEST - 1 VIEW  COMPARISON:  03/03/2014  FINDINGS: The left subclavian central line still extends along the left side of the heart and mediastinum. Similar to the previous chest radiograph reports, this could be within a persistent left SVC but cannot exclude intra-arterial placement. No evidence for a pneumothorax. Stable appearance of the heart and mediastinum with median sternotomy wires. Lungs are clear without airspace disease or pulmonary edema.  IMPRESSION: Abnormal position of the left subclavian central line as described.  No focal chest disease.   Electronically Signed   By: Richarda Overlie M.D.   On: 03/04/2014 08:16   Dg Chest Portable 1 View  03/03/2014   CLINICAL DATA:  Altered mental status.  EXAM: PORTABLE CHEST - 1 VIEW  COMPARISON:  March 03, 2014.  FINDINGS: Stable cardiomediastinal silhouette. Status post coronary artery bypass graft. No pneumothorax or pleural effusion is noted. No acute pulmonary disease is noted. Left internal jugular catheter has been removed and replaced with left subclavian catheter; this catheter also projects over the left side of the mediastinum. This either represents intra-arterial position or congenital venous variant such as left-sided superior vena cava. Bony thorax is intact.  IMPRESSION: No acute cardiopulmonary abnormality seen. Left internal jugular catheter has been removed and replaced with left subclavian catheter, which also demonstrates left-sided position over mediastinum. As described above, this represents either intra-arterial position or congenital venous variant such as left-sided superior vena cava.   Electronically Signed   By: Roque Lias M.D.   On: 03/03/2014 18:39   Dg Chest Port 1 View  03/03/2014   CLINICAL DATA:  Status post line placement.  EXAM: PORTABLE CHEST - 1 VIEW  COMPARISON:  None.  FINDINGS: There is a left internal jugular central venous catheter with the tip projecting over the left aspect of the mediastinum. Patient is rotated, limiting evaluation. Grossly unremarkable mediastinal contours given the patient rotation. Patient status post median sternotomy and CABG procedure. The lungs are well aerated. No large consolidative pulmonary opacities. No pleural effusion or pneumothorax. The left costophrenic angle is excluded from view. Regional skeleton is unremarkable.  IMPRESSION:AAAA: IMPRESSION:AAAA Left internal jugular central venous catheter tip projects over the left aspect of the mediastinum. This is concerning for intra-arterial line placement in the absence of known variant anatomy such as left-sided superior vena cava. Recommend clinical correlation, potentially with arterial blood gas. Additionally followup chest radiograph with repositioning is recommended.  Critical  Value/emergent results were called by telephone at the time of interpretation on 03/03/2014 at 5:34 PM to Dr. Marin Shutter, who verbally acknowledged these results.   Electronically Signed   By: Annia Belt M.D.   On: 03/03/2014 17:44   ASSESSMENT/PLAN:  1.  Acute myocardial infarction - non-ST elevation now with troponin > 20 and CPK MB consistent with MI and not rhabdo.  2D echo prelim done which I personally reviewed revealed preserved LVF with ? Basal inferoseptal and basal inferior HK otherwise no RWMA's.  He is still requiring pressor support to maintain adequate BP.  I have reviewed the case with Dr. Excell Seltzer and feel that he should go to cath.  I have spoken with his granddaughter and explained his condition and the  need to proceed with cath.  We discussed the risks of acute nephrotoxicity from IV contrast on chronic renal insufficiency and potential threat of acute renal failure and need for HD post op especially since he will need more contrast with grafts.  His granddaughter says that he was fully functional before this, driving himself to [employer] each morning and lived alone.  His daughter is ill with stage 4 Gliobastoma and is going into a nursing home.  I tried contacting her but could not reach her. - ASA/IV Heparin gtt - statin when taking PO - no beta blocker due to hypotension - emergent cath to evaluate coronary anatomy and grafts 2.  ASCAD with remote MI and CABG 3.  Shock felt initially to be due to hypovolemic shock but still hypotensive despite 5L fluid resuscitation - continue Levophed 4.  DM 5.  Dementia but lives alone and fully functional per granddaughter and brother 136.  Dyslipidemia         Quintella ReichertURNER,TRACI R, MD  03/04/2014  10:40 AM

## 2014-03-04 NOTE — Progress Notes (Signed)
ANTICOAGULATION CONSULT NOTE - Follow Up Consult  Pharmacy Consult for heparin Indication: NSTEMI  Labs:  Recent Labs  03/03/14 1446 03/03/14 1629 03/03/14 2200 03/03/14 2221 03/04/14 03/04/14 0515  HGB 15.2  --   --   --   --   --   HCT 47.7  --   --   --   --   --   PLT 141*  --   --   --   --   --   LABPROT 13.5  --   --   --   --   --   INR 1.05  --   --   --   --   --   HEPARINUNFRC  --   --   --   --   --  0.57  CREATININE 2.01* 1.64* 1.68*  --  1.68*  --   CKTOTAL 249*  --   --   --   --   --   TROPONINI 0.42* 1.71*  --  11.22*  --   --     Assessment/Plan:  80yo male therapeutic on heparin with initial dosing for NSTEMI.  Will continue gtt at current rate and confirm stable with additional level.  Vernard GamblesVeronda Ella Guillotte, PharmD, BCPS  03/04/2014,5:44 AM

## 2014-03-04 NOTE — CV Procedure (Signed)
   Cardiac Catheterization Procedure Note  Name: [name] GustRobert Dorce MRN: [id number] DOB: 12/09/1929  Procedure: Left Heart Cath, Selective Coronary Angiography, SVG angiography, LIMA angiography  Indication: 80 year-old male with extensive CAD, 3 prior CABG surgeries, who presented with obtundation, shock, and NSTEMI with troponin greater than 20. Consent obtained by Dr Mayford Knifeurner from granddaughter on telephone who is pt's next of kin.    Procedural details: The right groin was prepped, draped, and anesthetized with 1% lidocaine. The groin was extensively scarred. Using modified Seldinger technique, a 5 French sheath was introduced into the right femoral artery. Standard Judkins catheters were used for coronary angiography, SVG angiography, and LIMA angiography. The right subclavian artery was injected to evaluated the RIMA as his graft anatomy initially was unknown. Catheter exchanges were performed over a guidewire. There were no immediate procedural complications. The patient was transferred to the post catheterization recovery area for further monitoring.  Procedural Findings: Hemodynamics:  AO 152/58 LV 152/5   Coronary angiography: Coronary dominance: right  Left mainstem: severely calcified, total occlusion  Left anterior descending (LAD): total proximal occlusion with heavy calcification  Left circumflex (LCx): total occlusion with heavy calcification.   Right coronary artery (RCA): total proximal occlusion  Free RIMA-LAD: patent. The RIMA is anastomosed to the distal LAD and the LAD wraps the LV apex.  SVG - right PDA: diffuse irregularity but no significant obstruction noted. Extensive perforators supplied by the PDA.  SVG's - OM: all occluded  LIMA - diagonal: patent  Left ventriculography: deferred  Final Conclusions:   1. Severe native 3 vessel CAD with total occlusion of the LM and RCA 2. Patency of the free RIMA-LAD, LIMA-diagonal, and SVG-PDA 3. Total occlusion of the  SVG-LCx (appears chronic) 4. Low LVEDP  Recommendations: medical therapy for CAD. Suspect Type 2 NSTEMI. Depsite 5 liters of fluid, the patient's LVEDP is 5 mm Hg. Suggestive of septic/vasodilatory shock.   Tonny BollmanMichael Reinhold Rickey 03/04/2014, 1:12 PM

## 2014-03-04 NOTE — Progress Notes (Signed)
UR Completed.  [name] [name] [phone] 03/04/2014  

## 2014-03-05 ENCOUNTER — Inpatient Hospital Stay (HOSPITAL_COMMUNITY): Payer: Medicare HMO

## 2014-03-05 DIAGNOSIS — R4182 Altered mental status, unspecified: Secondary | ICD-10-CM

## 2014-03-05 LAB — BASIC METABOLIC PANEL
BUN: 48 mg/dL — ABNORMAL HIGH (ref 6–23)
CALCIUM: 7.7 mg/dL — AB (ref 8.4–10.5)
CO2: 27 meq/L (ref 19–32)
CREATININE: 1.06 mg/dL (ref 0.50–1.35)
Chloride: 120 mEq/L — ABNORMAL HIGH (ref 96–112)
GFR, EST AFRICAN AMERICAN: 73 mL/min — AB (ref >90)
GFR, EST NON AFRICAN AMERICAN: 63 mL/min — AB (ref >90)
Glucose, Bld: 145 mg/dL — ABNORMAL HIGH (ref 70–99)
Potassium: 3 mEq/L — ABNORMAL LOW (ref 3.7–5.3)
SODIUM: 157 meq/L — AB (ref 137–147)

## 2014-03-05 LAB — CBC WITH DIFFERENTIAL/PLATELET
BASOS ABS: 0 10*3/uL (ref 0.0–0.1)
Basophils Relative: 0 % (ref 0–1)
EOS ABS: 0 10*3/uL (ref 0.0–0.7)
Eosinophils Relative: 0 % (ref 0–5)
HCT: 37.7 % — ABNORMAL LOW (ref 39.0–52.0)
HEMOGLOBIN: 12.4 g/dL — AB (ref 13.0–17.0)
Lymphocytes Relative: 9 % — ABNORMAL LOW (ref 12–46)
Lymphs Abs: 1 10*3/uL (ref 0.7–4.0)
MCH: 29.4 pg (ref 26.0–34.0)
MCHC: 32.9 g/dL (ref 30.0–36.0)
MCV: 89.3 fL (ref 78.0–100.0)
MONOS PCT: 5 % (ref 3–12)
Monocytes Absolute: 0.5 10*3/uL (ref 0.1–1.0)
Neutro Abs: 9.4 10*3/uL — ABNORMAL HIGH (ref 1.7–7.7)
Neutrophils Relative %: 87 % — ABNORMAL HIGH (ref 43–77)
PLATELETS: 78 10*3/uL — AB (ref 150–400)
RBC: 4.22 MIL/uL (ref 4.22–5.81)
RDW: 14.9 % (ref 11.5–15.5)
WBC: 10.9 10*3/uL — ABNORMAL HIGH (ref 4.0–10.5)

## 2014-03-05 LAB — GLUCOSE, CAPILLARY
GLUCOSE-CAPILLARY: 135 mg/dL — AB (ref 70–99)
GLUCOSE-CAPILLARY: 147 mg/dL — AB (ref 70–99)
GLUCOSE-CAPILLARY: 148 mg/dL — AB (ref 70–99)
GLUCOSE-CAPILLARY: 159 mg/dL — AB (ref 70–99)
GLUCOSE-CAPILLARY: 159 mg/dL — AB (ref 70–99)
GLUCOSE-CAPILLARY: 166 mg/dL — AB (ref 70–99)
GLUCOSE-CAPILLARY: 177 mg/dL — AB (ref 70–99)
GLUCOSE-CAPILLARY: 186 mg/dL — AB (ref 70–99)
GLUCOSE-CAPILLARY: 187 mg/dL — AB (ref 70–99)
GLUCOSE-CAPILLARY: 227 mg/dL — AB (ref 70–99)
Glucose-Capillary: 127 mg/dL — ABNORMAL HIGH (ref 70–99)
Glucose-Capillary: 125 mg/dL — ABNORMAL HIGH (ref 70–99)
Glucose-Capillary: 134 mg/dL — ABNORMAL HIGH (ref 70–99)
Glucose-Capillary: 134 mg/dL — ABNORMAL HIGH (ref 70–99)
Glucose-Capillary: 205 mg/dL — ABNORMAL HIGH (ref 70–99)
Glucose-Capillary: 133 mg/dL — ABNORMAL HIGH (ref 70–99)
Glucose-Capillary: 129 mg/dL — ABNORMAL HIGH (ref 70–99)
Glucose-Capillary: 128 mg/dL — ABNORMAL HIGH (ref 70–99)
Glucose-Capillary: 149 mg/dL — ABNORMAL HIGH (ref 70–99)
Glucose-Capillary: 164 mg/dL — ABNORMAL HIGH (ref 70–99)
Glucose-Capillary: 174 mg/dL — ABNORMAL HIGH (ref 70–99)
Glucose-Capillary: 178 mg/dL — ABNORMAL HIGH (ref 70–99)

## 2014-03-05 LAB — HEPATIC FUNCTION PANEL
ALBUMIN: 1.8 g/dL — AB (ref 3.5–5.2)
ALK PHOS: 65 U/L (ref 39–117)
ALT: 22 U/L (ref 0–53)
AST: 42 U/L — ABNORMAL HIGH (ref 0–37)
Total Bilirubin: 0.4 mg/dL (ref 0.3–1.2)
Total Protein: 4.1 g/dL — ABNORMAL LOW (ref 6.0–8.3)

## 2014-03-05 LAB — MAGNESIUM: MAGNESIUM: 1.7 mg/dL (ref 1.5–2.5)

## 2014-03-05 LAB — PROTIME-INR
INR: 1.12 (ref 0.00–1.49)
Prothrombin Time: 14.2 seconds (ref 11.6–15.2)

## 2014-03-05 LAB — PROCALCITONIN: Procalcitonin: 0.88 ng/mL

## 2014-03-05 LAB — PHOSPHORUS: PHOSPHORUS: 0.8 mg/dL — AB (ref 2.3–4.6)

## 2014-03-05 LAB — LACTIC ACID, PLASMA: Lactic Acid, Venous: 1.6 mmol/L (ref 0.5–2.2)

## 2014-03-05 LAB — HEPARIN LEVEL (UNFRACTIONATED): HEPARIN UNFRACTIONATED: 0.3 [IU]/mL (ref 0.30–0.70)

## 2014-03-05 LAB — PRO B NATRIURETIC PEPTIDE: Pro B Natriuretic peptide (BNP): 6895 pg/mL — ABNORMAL HIGH (ref 0–450)

## 2014-03-05 MED ORDER — HEPARIN SODIUM (PORCINE) 5000 UNIT/ML IJ SOLN
5000.0000 [IU] | Freq: Three times a day (TID) | INTRAMUSCULAR | Status: DC
Start: 2014-03-05 — End: 2014-03-06
  Administered 2014-03-05 – 2014-03-06 (×2): 5000 [IU] via SUBCUTANEOUS
  Filled 2014-03-05 (×5): qty 1

## 2014-03-05 MED ORDER — MAGNESIUM SULFATE 40 MG/ML IJ SOLN
2.0000 g | Freq: Once | INTRAMUSCULAR | Status: AC
  Administered 2014-03-05: 2 g via INTRAVENOUS
  Filled 2014-03-05: qty 50

## 2014-03-05 MED ORDER — POTASSIUM PHOSPHATE DIBASIC 3 MMOLE/ML IV SOLN
30.0000 mmol | Freq: Once | INTRAVENOUS | Status: AC
  Administered 2014-03-05: 30 mmol via INTRAVENOUS
  Filled 2014-03-05: qty 10

## 2014-03-05 MED ORDER — FREE WATER
200.0000 mL | Freq: Three times a day (TID) | Status: DC
  Administered 2014-03-05 (×2): 200 mL

## 2014-03-05 MED ORDER — INSULIN ASPART 100 UNIT/ML ~~LOC~~ SOLN
2.0000 [IU] | SUBCUTANEOUS | Status: DC
  Administered 2014-03-05 – 2014-03-06 (×4): 4 [IU] via SUBCUTANEOUS

## 2014-03-05 MED ORDER — INSULIN GLARGINE 100 UNIT/ML ~~LOC~~ SOLN
10.0000 [IU] | SUBCUTANEOUS | Status: DC
  Administered 2014-03-05: 10 [IU] via SUBCUTANEOUS
  Filled 2014-03-05 (×3): qty 0.1

## 2014-03-05 NOTE — Progress Notes (Signed)
CRITICAL VALUE ALERT  Critical value received:  Phos 0.8  Date of notification:  03/05/14  Time of notification:  0545  Critical value read back:yes  Nurse who received alert:  Tory EmeraldMichelle Irais Mottram, RN  MD notified (1st page):  Dr Deterding  Time of first page:  [phone]  MD notified (2nd page):  Time of second page:  Responding MD:  Dr Darrick Pennaeterding  Time MD responded:  [phone]

## 2014-03-05 NOTE — Progress Notes (Signed)
SUBJECTIVE:  Awake  OBJECTIVE:   Vitals:   Filed Vitals:   03/05/14 0615 03/05/14 0630 03/05/14 0700 03/05/14 0747  BP: 125/55 123/43 108/46 102/41  Pulse: 74 60 87 29  Temp: 99 F (37.2 C) 99 F (37.2 C) 99 F (37.2 C) 98.9 F (37.2 C)  TempSrc:      Resp: 23 19 17 22   Height:      Weight:      SpO2: 96% 95% 96% 98%   I&O's:   Intake/Output Summary (Last 24 hours) at 03/05/14 1111 Last data filed at 03/05/14 1610  Gross per 24 hour  Intake 2872.97 ml  Output   3165 ml  Net -292.03 ml   TELEMETRY: Reviewed telemetry pt in NSR with PAC's:     PHYSICAL EXAM General: Well developed, well nourished, in no acute distress Head: Eyes PERRLA, No xanthomas.   Normal cephalic and atramatic  Lungs:   Clear bilaterally to auscultation and percussion. Heart:   HRRR S1 S2 Pulses are 2+ & equal. Occasional ectopy Abdomen: Bowel sounds are positive, abdomen soft and non-tender without masses  Extremities:  1+ edema LE Neuro: Alert    LABS: Basic Metabolic Panel:  Recent Labs  96/04/54 1629  03/04/14 1500 03/05/14 0445  NA 154*  < > 156* 157*  K 5.3  < > 3.6* 3.0*  CL 112  < > 120* 120*  CO2 8*  < > 24 27  GLUCOSE 500*  < > 244* 145*  BUN 104*  < > 68* 48*  CREATININE 1.64*  < > 1.14 1.06  CALCIUM 7.9*  < > 7.4* 7.7*  MG 2.3  --   --  1.7  PHOS 4.0  --   --  0.8*  < > = values in this interval not displayed. Liver Function Tests:  Recent Labs  03/04/14 0500 03/05/14 0445  AST 51* 42*  ALT 20 22  ALKPHOS 50 65  BILITOT 0.4 0.4  PROT 3.8* 4.1*  ALBUMIN 1.8* 1.8*   No results found for this basename: LIPASE, AMYLASE,  in the last 72 hours CBC:  Recent Labs  03/03/14 1446 03/04/14 0515 03/05/14 0445  WBC 6.4 5.4 10.9*  NEUTROABS 5.2  --  9.4*  HGB 15.2 12.0* 12.4*  HCT 47.7 36.1* 37.7*  MCV 95.0 90.0 89.3  PLT 141* 77* 78*   Cardiac Enzymes:  Recent Labs  03/03/14 1446 03/03/14 1629 03/03/14 2221 03/04/14 0500  CKTOTAL 249*  --   --  889*    CKMB  --   --   --  49.7*  TROPONINI 0.42* 1.71* 11.22* >20.00*   BNP: No components found with this basename: POCBNP,  D-Dimer: No results found for this basename: DDIMER,  in the last 72 hours Hemoglobin A1C: No results found for this basename: HGBA1C,  in the last 72 hours Fasting Lipid Panel: No results found for this basename: CHOL, HDL, LDLCALC, TRIG, CHOLHDL, LDLDIRECT,  in the last 72 hours Thyroid Function Tests: No results found for this basename: TSH, T4TOTAL, FREET3, T3FREE, THYROIDAB,  in the last 72 hours Anemia Panel: No results found for this basename: VITAMINB12, FOLATE, FERRITIN, TIBC, IRON, RETICCTPCT,  in the last 72 hours Coag Panel:   Lab Results  Component Value Date   INR 1.12 03/05/2014   INR 1.05 03/03/2014    RADIOLOGY: Ct Head Wo Contrast  03/03/2014   CLINICAL DATA:  Confused, unresponsive  EXAM: CT HEAD WITHOUT CONTRAST  CT CERVICAL SPINE WITHOUT CONTRAST  TECHNIQUE: Multidetector CT imaging of the head and cervical spine was performed following the standard protocol without intravenous contrast. Multiplanar CT image reconstructions of the cervical spine were also generated.  COMPARISON:  None.  FINDINGS: CT HEAD FINDINGS  There is no evidence of mass effect, midline shift, or extra-axial fluid collections. There is no evidence of a space-occupying lesion or intracranial hemorrhage. There is no evidence of a cortical-based area of acute infarction. There is generalized cerebral atrophy. There is periventricular white matter low attenuation likely secondary to microangiopathy.  The ventricles and sulci are appropriate for the patient's age. The basal cisterns are patent.  Visualized portions of the orbits are unremarkable. The visualized portions of the paranasal sinuses and mastoid air cells are unremarkable. Cerebrovascular atherosclerotic calcifications are noted.  The osseous structures are unremarkable.  CT CERVICAL SPINE FINDINGS  The alignment is anatomic.  The vertebral body heights are maintained. There is no acute fracture. There is no static listhesis. The prevertebral soft tissues are normal. The intraspinal soft tissues are not fully imaged on this examination due to poor soft tissue contrast, but there is no gross soft tissue abnormality.  There is degenerative disc disease throughout the cervical spine. There broad-based disc bulges at C2-3, C3-4, C4-5, C5-6 and C6-7. There is bilateral facet arthropathy extending from C3-4 through C7-T1. There is bilateral foraminal stenosis at C5-6 and C6-7. Marland Kitchen. There is unilateral right foraminal stenosis at C3-4 and C4-5.  There is left carotid artery atherosclerosis. There is evidence of prior right carotid endarterectomy.  IMPRESSION: 1. No acute intracranial pathology. 2. No acute osseous injury of the cervical spine. 3. Diffuse cervical spine spondylosis as described above.   Electronically Signed   By: Elige KoHetal  Patel   On: 03/03/2014 16:00   Ct Cervical Spine Wo Contrast  03/03/2014   CLINICAL DATA:  Confused, unresponsive  EXAM: CT HEAD WITHOUT CONTRAST  CT CERVICAL SPINE WITHOUT CONTRAST  TECHNIQUE: Multidetector CT imaging of the head and cervical spine was performed following the standard protocol without intravenous contrast. Multiplanar CT image reconstructions of the cervical spine were also generated.  COMPARISON:  None.  FINDINGS: CT HEAD FINDINGS  There is no evidence of mass effect, midline shift, or extra-axial fluid collections. There is no evidence of a space-occupying lesion or intracranial hemorrhage. There is no evidence of a cortical-based area of acute infarction. There is generalized cerebral atrophy. There is periventricular white matter low attenuation likely secondary to microangiopathy.  The ventricles and sulci are appropriate for the patient's age. The basal cisterns are patent.  Visualized portions of the orbits are unremarkable. The visualized portions of the paranasal sinuses and mastoid air  cells are unremarkable. Cerebrovascular atherosclerotic calcifications are noted.  The osseous structures are unremarkable.  CT CERVICAL SPINE FINDINGS  The alignment is anatomic. The vertebral body heights are maintained. There is no acute fracture. There is no static listhesis. The prevertebral soft tissues are normal. The intraspinal soft tissues are not fully imaged on this examination due to poor soft tissue contrast, but there is no gross soft tissue abnormality.  There is degenerative disc disease throughout the cervical spine. There broad-based disc bulges at C2-3, C3-4, C4-5, C5-6 and C6-7. There is bilateral facet arthropathy extending from C3-4 through C7-T1. There is bilateral foraminal stenosis at C5-6 and C6-7. Marland Kitchen. There is unilateral right foraminal stenosis at C3-4 and C4-5.  There is left carotid artery atherosclerosis. There is evidence of prior right carotid endarterectomy.  IMPRESSION: 1. No acute intracranial  pathology. 2. No acute osseous injury of the cervical spine. 3. Diffuse cervical spine spondylosis as described above.   Electronically Signed   By: Elige Ko   On: 03/03/2014 16:00   Dg Chest Port 1 View  03/04/2014   CLINICAL DATA:  Hypoxia.  EXAM: PORTABLE CHEST - 1 VIEW  COMPARISON:  03/03/2014  FINDINGS: The left subclavian central line still extends along the left side of the heart and mediastinum. Similar to the previous chest radiograph reports, this could be within a persistent left SVC but cannot exclude intra-arterial placement. No evidence for a pneumothorax. Stable appearance of the heart and mediastinum with median sternotomy wires. Lungs are clear without airspace disease or pulmonary edema.  IMPRESSION: Abnormal position of the left subclavian central line as described.  No focal chest disease.   Electronically Signed   By: Richarda Overlie M.D.   On: 03/04/2014 08:16   Dg Chest Portable 1 View  03/03/2014   CLINICAL DATA:  Altered mental status.  EXAM: PORTABLE CHEST - 1  VIEW  COMPARISON:  March 03, 2014.  FINDINGS: Stable cardiomediastinal silhouette. Status post coronary artery bypass graft. No pneumothorax or pleural effusion is noted. No acute pulmonary disease is noted. Left internal jugular catheter has been removed and replaced with left subclavian catheter; this catheter also projects over the left side of the mediastinum. This either represents intra-arterial position or congenital venous variant such as left-sided superior vena cava. Bony thorax is intact.  IMPRESSION: No acute cardiopulmonary abnormality seen. Left internal jugular catheter has been removed and replaced with left subclavian catheter, which also demonstrates left-sided position over mediastinum. As described above, this represents either intra-arterial position or congenital venous variant such as left-sided superior vena cava.   Electronically Signed   By: Roque Lias M.D.   On: 03/03/2014 18:39   Dg Chest Port 1 View  03/03/2014   CLINICAL DATA:  Status post line placement.  EXAM: PORTABLE CHEST - 1 VIEW  COMPARISON:  None.  FINDINGS: There is a left internal jugular central venous catheter with the tip projecting over the left aspect of the mediastinum. Patient is rotated, limiting evaluation. Grossly unremarkable mediastinal contours given the patient rotation. Patient status post median sternotomy and CABG procedure. The lungs are well aerated. No large consolidative pulmonary opacities. No pleural effusion or pneumothorax. The left costophrenic angle is excluded from view. Regional skeleton is unremarkable.  IMPRESSION:AAAA: IMPRESSION:AAAA Left internal jugular central venous catheter tip projects over the left aspect of the mediastinum. This is concerning for intra-arterial line placement in the absence of known variant anatomy such as left-sided superior vena cava. Recommend clinical correlation, potentially with arterial blood gas. Additionally followup chest radiograph with repositioning is  recommended.  Critical Value/emergent results were called by telephone at the time of interpretation on 03/03/2014 at 5:34 PM to Dr. Marin Shutter, who verbally acknowledged these results.   Electronically Signed   By: Annia Belt M.D.   On: 03/03/2014 17:44    ASSESSMENT/PLAN:  1. NSTEMI secondary to type II demand ischemia from sepsis now with troponin > 20.  Cath yesterday showed severe native 3 vessel CAD with total occlusion of the LM and RCA , patency of the free RIMA-LAD, LIMA-diagonal, and SVG-PDA, total occlusion of the SVG-LCx (appears chronic) and low LVEDP most consistent with septic/vasodiliatory shock. Medical management of CAD.   - continue ASA - statin when taking PO  - no beta blocker due to hypotension  - d/c IV Heparin gtt  2. ASCAD with remote MI and CABG  3. Septic/vasodilatory shock - BP much improve - wean Levophed to off as BP tolerates 4. DM  5. Dementia but lives alone and fully functional per granddaughter and brother  36. Dyslipidemia 7.  Hypokalemia    Quintella Reichert, MD  03/05/2014  11:11 AM

## 2014-03-05 NOTE — Progress Notes (Signed)
eLink Physician-Brief Progress Note Patient Name: [name] GustRobert [name] DOB: 10/25/1929 MRN: [id number]  Date of Service  03/05/2014   HPI/Events of Note  Hypophosphatemia and hypokalemia   eICU Interventions  Phos and potassium replaced   Intervention Category Intermediate Interventions: Electrolyte abnormality - evaluation and management  DETERDING,ELIZABETH 03/05/2014, 6:25 AM

## 2014-03-05 NOTE — Progress Notes (Signed)
Foley Catheter removed per nurse driven protocol

## 2014-03-05 NOTE — Progress Notes (Signed)
CRITICAL VALUE ALERT  Critical value received:  Positive blood cultures for gram negative rods  Date of notification:  03/05/14  Time of notification:  0630  Critical value read back:yes  Nurse who received alert:  Jamesetta Soarla Porter, RN  MD notified (1st page):  Dr Deterding  Time of first page:  0630  MD notified (2nd page):  Time of second page:  Responding MD:  Dr Darrick Pennaeterding  Time MD responded:  0630

## 2014-03-05 NOTE — Progress Notes (Signed)
PULMONARY / CRITICAL CARE MEDICINE   Name: Glendale Youngblood MRN: [id number] DOB: [birth year]    ADMISSION DATE:  03/03/2014  REFERRING MD :  EDP PRIMARY SERVICE: PCCM  CHIEF COMPLAINT:  Found down  BRIEF PATIENT DESCRIPTION:    80 yo male with hx HTN, DM, CAD, MI presented 3/12 after being found unresponsive.  Pt last seen ~5 days prior to admit by family at which time he was reportedly confused, not himself.  Neighbors called EMS r/t multiple newspapers outside.  Unresponsive, hypotensive, hypothermic on EMS arrival, found in urine and fesces with multiple pressure ulcers.  ?DKA. BP 80's in ER despite 3L fluids, hypothermic with temp 91.     LINES / TUBES: L IJ CVL 3/12 >>> Foley 3/12 >>>  CULTURES: 3/12  Blood >>> 3/12  Urine >>>  Results for orders placed during the hospital encounter of 03/03/14  URINE CULTURE     Status: None   Collection Time    03/03/14  5:21 PM      Result Value Ref Range Status   Specimen Description URINE, CATHETERIZED   Final   Special Requests NONE   Final   Culture  Setup Time     Final   Value: 03/03/2014 18:57     Performed at [facility] Count     Final   Value: NO GROWTH     Performed at [facility]   Culture     Final   Value: NO GROWTH     Performed at [facility]   Report Status 03/04/2014 FINAL   Final  CULTURE, BLOOD (ROUTINE X 2)     Status: None   Collection Time    03/03/14  6:40 PM      Result Value Ref Range Status   Specimen Description BLOOD HAND RIGHT   Final   Special Requests BOTTLES DRAWN AEROBIC AND ANAEROBIC 5CCBLUE 4CCRED   Final   Culture  Setup Time     Final   Value: 03/03/2014 21:58     Performed at [facility]   Culture     Final   Value:        BLOOD CULTURE RECEIVED NO GROWTH TO DATE CULTURE WILL BE HELD FOR 5 DAYS BEFORE ISSUING A FINAL NEGATIVE REPORT     Performed at [facility]   Report Status PENDING   Incomplete  CULTURE, BLOOD (ROUTINE X 2)      Status: None   Collection Time    03/03/14  6:52 PM      Result Value Ref Range Status   Specimen Description BLOOD HAND LEFT   Final   Special Requests BOTTLES DRAWN AEROBIC AND ANAEROBIC 5CC   Final   Culture  Setup Time     Final   Value: 03/03/2014 22:01     Performed at [facility]   Culture     Final   Value: GRAM NEGATIVE RODS     Note: Gram Stain Report Called to,Read Back By and Verified With: PORTER RN AT [phone] 03/05/14 BY SNOLO     Performed at [facility]   Report Status PENDING   Incomplete  MRSA PCR SCREENING     Status: None   Collection Time    03/03/14 10:04 PM      Result Value Ref Range Status   MRSA by PCR NEGATIVE  NEGATIVE Final   Comment:  The GeneXpert MRSA Assay (FDA     approved for NASAL specimens     only), is one component of a     comprehensive MRSA colonization     surveillance program. It is not     intended to diagnose MRSA     infection nor to guide or     monitor treatment for     MRSA infections.      ANTIBIOTICS: Vancomycin 3/12>>>3/15 Zosyn 3/12 >>> Anti-infectives   Start     Dose/Rate Route Frequency Ordered Stop   03/04/14 1800  vancomycin (VANCOCIN) IVPB 750 mg/150 ml premix     750 mg 150 mL/hr over 60 Minutes Intravenous Every 24 hours 03/03/14 2131     03/04/14 0100  piperacillin-tazobactam (ZOSYN) IVPB 3.375 g     3.375 g 12.5 mL/hr over 240 Minutes Intravenous 3 times per day 03/03/14 2131     03/03/14 1800  vancomycin (VANCOCIN) IVPB 1000 mg/200 mL premix     1,000 mg 200 mL/hr over 60 Minutes Intravenous  Once 03/03/14 1749 03/03/14 2058   03/03/14 1800  piperacillin-tazobactam (ZOSYN) IVPB 3.375 g     3.375 g 100 mL/hr over 30 Minutes Intravenous  Once 03/03/14 1750 03/03/14 1931       SIGNIFICANT EVENTS / STUDIES:  3/12  Head / c-spine CT >>> nad  03/04/14: Not intubated. On 7mcg levophed. Drowsy, ? Cheyne stokes breathing but did follow commands and moved all 4s to RN.  Metabolic  acidosis on aBG. Trop > 20 ; on IV heparin. Cath - clean. LVEDP low   SUBJECTIVE/OVERNIGHT/INTERVAL HX 03/05/14 Remains unintubated. Off pressors x 4h. Drowsy but more responsive than yesterday: says he is "I am better"  VITAL SIGNS: Temp:  [97.9 F (36.6 C)-99.5 F (37.5 C)] 98.2 F (36.8 C) (03/14 1200) Pulse Rate:  [27-104] 52 (03/14 1200) Resp:  [14-33] 25 (03/14 1200) BP: (95-144)/(40-72) 116/58 mmHg (03/14 1200) SpO2:  [87 %-99 %] 93 % (03/14 1200) Weight:  [68.1 kg (150 lb 2.1 oz)] 68.1 kg (150 lb 2.1 oz) (03/14 0437)  HEMODYNAMICS: CVP:  [0 mmHg-2 mmHg] 1 mmHg VENTILATOR SETTINGS:   INTAKE / OUTPUT: Intake/Output     03/13 0701 - 03/14 0700 03/14 0701 - 03/15 0700   I.V. (mL/kg) 3003.7 (44.1) 466.7 (6.9)   Other     IV Piggyback 162.5 425   Total Intake(mL/kg) 3166.2 (46.5) 891.7 (13.1)   Urine (mL/kg/hr) 3880 (2.4) 520 (1)   Total Output 3880 520   Net -713.8 +371.7        Stool Occurrence 1 x      PHYSICAL EXAMINATION: General:  Frail, thin, chronically ill appearing male, dirty, disheveled  Neuro:  Lethargic RASS-2 equivalent. Small bruise to left fronto-temp area HEENT:  Mm dry, pale, no JVD, cervical collar Cardiovascular:  s1s2 rrr, sternotomy scar  Lungs:  RR 15.  Abdomen:  Soft, nt, +bs Musculoskeletal:  Pale, multiple small pressure ulcers, no edema   LABS:  PULMONARY  Recent Labs Lab 03/03/14 1526 03/03/14 1759 03/03/14 1958 03/03/14 2038 03/04/14 0937 03/04/14 1002  PHART 7.217* 7.047*  --  7.183*  --  7.394  PCO2ART 23.2* 31.4*  --  34.7*  --  41.3  PO2ART 432.0* 84.0  --  451.0*  --  136.0*  HCO3 9.4* 8.6* 11.4* 13.0*  --  25.3*  TCO2 10 10 12 14   --  27  O2SAT 100.0 90.0 79.0 100.0 73.5 99.0    CBC  Recent Labs  Lab 03/03/14 1446 03/04/14 0515 03/05/14 0445  HGB 15.2 12.0* 12.4*  HCT 47.7 36.1* 37.7*  WBC 6.4 5.4 10.9*  PLT 141* 77* 78*    COAGULATION  Recent Labs Lab 03/03/14 1446 03/05/14 0445  INR 1.05 1.12     CARDIAC    Recent Labs Lab 03/03/14 1446 03/03/14 1629 03/03/14 2221 03/04/14 0500  TROPONINI 0.42* 1.71* 11.22* >20.00*    Recent Labs Lab 03/05/14 0445  PROBNP 6895.0*     CHEMISTRY  Recent Labs Lab 03/03/14 1446 03/03/14 1629 03/03/14 2200 03/04/14 03/04/14 0500 03/04/14 1500 03/05/14 0445  NA 154* 154* 158* 159* 159* 156* 157*  K 5.3 5.3 4.1 4.0 3.7 3.6* 3.0*  CL 101 112 116* 119* 121* 120* 120*  CO2 9* 8* 15* 19 23 24 27   GLUCOSE 640* 500* 413* 325* 165* 244* 145*  BUN 127* 104* 105* 102* 87* 68* 48*  CREATININE 2.01* 1.64* 1.68* 1.68* 1.41* 1.14 1.06  CALCIUM 9.3 7.9* 7.9* 7.9* 7.6* 7.4* 7.7*  MG  --  2.3  --   --   --   --  1.7  PHOS  --  4.0  --   --   --   --  0.8*   Estimated Creatinine Clearance: 51.8 ml/min (by C-G formula based on Cr of 1.06).   LIVER  Recent Labs Lab 03/03/14 1446 03/04/14 0500 03/05/14 0445  AST 16 51* 42*  ALT 18 20 22   ALKPHOS 73 50 65  BILITOT 0.7 0.4 0.4  PROT 5.0* 3.8* 4.1*  ALBUMIN 2.6* 1.8* 1.8*  INR 1.05  --  1.12     INFECTIOUS  Recent Labs Lab 03/03/14 1447 03/03/14 1629 03/04/14 0500 03/05/14 0445 03/05/14 0446  LATICACIDVEN 3.4* 1.8  --  1.6  --   PROCALCITON  --   --  1.09  --  0.88    Recent Labs Lab 03/04/14 0500 03/05/14 0446  PROCALCITON 1.09 0.88     ENDOCRINE CBG (last 3)   Recent Labs  03/05/14 0526 03/05/14 0620 03/05/14 0704  GLUCAP 134* 147* 186*         IMAGING x48h  Ct Head Wo Contrast  03/03/2014   CLINICAL DATA:  Confused, unresponsive  EXAM: CT HEAD WITHOUT CONTRAST  CT CERVICAL SPINE WITHOUT CONTRAST  TECHNIQUE: Multidetector CT imaging of the head and cervical spine was performed following the standard protocol without intravenous contrast. Multiplanar CT image reconstructions of the cervical spine were also generated.  COMPARISON:  None.  FINDINGS: CT HEAD FINDINGS  There is no evidence of mass effect, midline shift, or extra-axial fluid collections.  There is no evidence of a space-occupying lesion or intracranial hemorrhage. There is no evidence of a cortical-based area of acute infarction. There is generalized cerebral atrophy. There is periventricular white matter low attenuation likely secondary to microangiopathy.  The ventricles and sulci are appropriate for the patient's age. The basal cisterns are patent.  Visualized portions of the orbits are unremarkable. The visualized portions of the paranasal sinuses and mastoid air cells are unremarkable. Cerebrovascular atherosclerotic calcifications are noted.  The osseous structures are unremarkable.  CT CERVICAL SPINE FINDINGS  The alignment is anatomic. The vertebral body heights are maintained. There is no acute fracture. There is no static listhesis. The prevertebral soft tissues are normal. The intraspinal soft tissues are not fully imaged on this examination due to poor soft tissue contrast, but there is no gross soft tissue abnormality.  There is degenerative disc disease throughout the cervical  spine. There broad-based disc bulges at C2-3, C3-4, C4-5, C5-6 and C6-7. There is bilateral facet arthropathy extending from C3-4 through C7-T1. There is bilateral foraminal stenosis at C5-6 and C6-7. Marland Kitchen There is unilateral right foraminal stenosis at C3-4 and C4-5.  There is left carotid artery atherosclerosis. There is evidence of prior right carotid endarterectomy.  IMPRESSION: 1. No acute intracranial pathology. 2. No acute osseous injury of the cervical spine. 3. Diffuse cervical spine spondylosis as described above.   Electronically Signed   By: Elige Ko   On: 03/03/2014 16:00   Ct Cervical Spine Wo Contrast  03/03/2014   CLINICAL DATA:  Confused, unresponsive  EXAM: CT HEAD WITHOUT CONTRAST  CT CERVICAL SPINE WITHOUT CONTRAST  TECHNIQUE: Multidetector CT imaging of the head and cervical spine was performed following the standard protocol without intravenous contrast. Multiplanar CT image  reconstructions of the cervical spine were also generated.  COMPARISON:  None.  FINDINGS: CT HEAD FINDINGS  There is no evidence of mass effect, midline shift, or extra-axial fluid collections. There is no evidence of a space-occupying lesion or intracranial hemorrhage. There is no evidence of a cortical-based area of acute infarction. There is generalized cerebral atrophy. There is periventricular white matter low attenuation likely secondary to microangiopathy.  The ventricles and sulci are appropriate for the patient's age. The basal cisterns are patent.  Visualized portions of the orbits are unremarkable. The visualized portions of the paranasal sinuses and mastoid air cells are unremarkable. Cerebrovascular atherosclerotic calcifications are noted.  The osseous structures are unremarkable.  CT CERVICAL SPINE FINDINGS  The alignment is anatomic. The vertebral body heights are maintained. There is no acute fracture. There is no static listhesis. The prevertebral soft tissues are normal. The intraspinal soft tissues are not fully imaged on this examination due to poor soft tissue contrast, but there is no gross soft tissue abnormality.  There is degenerative disc disease throughout the cervical spine. There broad-based disc bulges at C2-3, C3-4, C4-5, C5-6 and C6-7. There is bilateral facet arthropathy extending from C3-4 through C7-T1. There is bilateral foraminal stenosis at C5-6 and C6-7. Marland Kitchen There is unilateral right foraminal stenosis at C3-4 and C4-5.  There is left carotid artery atherosclerosis. There is evidence of prior right carotid endarterectomy.  IMPRESSION: 1. No acute intracranial pathology. 2. No acute osseous injury of the cervical spine. 3. Diffuse cervical spine spondylosis as described above.   Electronically Signed   By: Elige Ko   On: 03/03/2014 16:00   Dg Chest Port 1 View  03/04/2014   CLINICAL DATA:  Hypoxia.  EXAM: PORTABLE CHEST - 1 VIEW  COMPARISON:  03/03/2014  FINDINGS: The left  subclavian central line still extends along the left side of the heart and mediastinum. Similar to the previous chest radiograph reports, this could be within a persistent left SVC but cannot exclude intra-arterial placement. No evidence for a pneumothorax. Stable appearance of the heart and mediastinum with median sternotomy wires. Lungs are clear without airspace disease or pulmonary edema.  IMPRESSION: Abnormal position of the left subclavian central line as described.  No focal chest disease.   Electronically Signed   By: Richarda Overlie M.D.   On: 03/04/2014 08:16   Dg Chest Portable 1 View  03/03/2014   CLINICAL DATA:  Altered mental status.  EXAM: PORTABLE CHEST - 1 VIEW  COMPARISON:  March 03, 2014.  FINDINGS: Stable cardiomediastinal silhouette. Status post coronary artery bypass graft. No pneumothorax or pleural effusion is noted. No acute  pulmonary disease is noted. Left internal jugular catheter has been removed and replaced with left subclavian catheter; this catheter also projects over the left side of the mediastinum. This either represents intra-arterial position or congenital venous variant such as left-sided superior vena cava. Bony thorax is intact.  IMPRESSION: No acute cardiopulmonary abnormality seen. Left internal jugular catheter has been removed and replaced with left subclavian catheter, which also demonstrates left-sided position over mediastinum. As described above, this represents either intra-arterial position or congenital venous variant such as left-sided superior vena cava.   Electronically Signed   By: Roque Lias M.D.   On: 03/03/2014 18:39   Dg Chest Port 1 View  03/03/2014   CLINICAL DATA:  Status post line placement.  EXAM: PORTABLE CHEST - 1 VIEW  COMPARISON:  None.  FINDINGS: There is a left internal jugular central venous catheter with the tip projecting over the left aspect of the mediastinum. Patient is rotated, limiting evaluation. Grossly unremarkable mediastinal  contours given the patient rotation. Patient status post median sternotomy and CABG procedure. The lungs are well aerated. No large consolidative pulmonary opacities. No pleural effusion or pneumothorax. The left costophrenic angle is excluded from view. Regional skeleton is unremarkable.  IMPRESSION:AAAA: IMPRESSION:AAAA Left internal jugular central venous catheter tip projects over the left aspect of the mediastinum. This is concerning for intra-arterial line placement in the absence of known variant anatomy such as left-sided superior vena cava. Recommend clinical correlation, potentially with arterial blood gas. Additionally followup chest radiograph with repositioning is recommended.  Critical Value/emergent results were called by telephone at the time of interpretation on 03/03/2014 at 5:34 PM to Dr. Marin Shutter, who verbally acknowledged these results.   Electronically Signed   By: Annia Belt M.D.   On: 03/03/2014 17:44      ASSESSMENT / PLAN:  PULMONARY A: Protects airway at this time At risk for aspiration At risk for respiratory failure due to high metabolic demand   - improved lethargy P:   Goal SpO2>92 Supplemental oxygen BiPAP contraindicated   CARDIOVASCULAR A: SIRS / Sepsis Hx HTN, CAD, MI, s/p CABG   - Definite NSTEMI. Cath clean. S/p IV heparin. Off pressors  P:  Goal MAP>65 Echo result awaited  RENAL A: Acute renal failure  Hypernatremia    - Improved creat but CK 889 post admission. Na high  P:   Change fluuids to D5 water; r Add free water via ng tube Goal CVP 8-10 Trend BMP  GASTROINTESTINAL A: Nutrition GIPx P:   NPO  Place ngg tube Protonix  HEMATOLOGIC A: Thrombocytopenia VTE Px P:  Trend CBC Heparin to continue for now but monitor platelets  INFECTIOUS A: Suspected sepsis: GNR bactermemia P:   DC vanc Cx / abx as above PCT  ENDOCRINE A: DM  DKA  Unknown adrenal function: cortisol 34.5 P:   ICU hyperglyucemia  protocol (dc DKA protocol) No role for steroids  NEUROLOGIC A: Acute encephalopathy likely metabolic  - improving  P:   C-collar for now (has c spine chronic sternosis) but will dc when more awake and no tenderness Monitor   The patient is critically ill with multiple organ systems failure and requires high complexity decision making for assessment and support, frequent evaluation and titration of therapies, application of advanced monitoring technologies and extensive interpretation of multiple databases.   Critical Care Time devoted to patient care services described in this note is  30  Minutes.  Dr. Kalman Shan, M.D., [facility]C.P Pulmonary and Critical Care Medicine  Staff Physician [facility] Pager: [phone], If no answer or between  15:00h - 7:00h: call [phone]  03/05/2014 2:18 PM

## 2014-03-05 NOTE — Progress Notes (Signed)
ANTICOAGULATION CONSULT NOTE - Follow Up Consult  Pharmacy Consult for Heparin restart post cath - transition to SQ heparin Indication: VTE prophylaxis  No Known Allergies  Patient Measurements: Height: 5\' 9"  (175.3 cm) Weight: 150 lb 2.1 oz (68.1 kg) IBW/kg (Calculated) : 70.7 Heparin Dosing Weight: 64.5 kg  Vital Signs: Temp: 98.2 F (36.8 C) (03/14 1200) BP: 116/58 mmHg (03/14 1200) Pulse Rate: 52 (03/14 1200) Labs:  Recent Labs  03/03/14 1446 03/03/14 1629  03/03/14 2221  03/04/14 0500 03/04/14 0515 03/04/14 1056 03/04/14 1500 03/05/14 0445  HGB 15.2  --   --   --   --   --  12.0*  --   --  12.4*  HCT 47.7  --   --   --   --   --  36.1*  --   --  37.7*  PLT 141*  --   --   --   --   --  77*  --   --  78*  LABPROT 13.5  --   --   --   --   --   --   --   --  14.2  INR 1.05  --   --   --   --   --   --   --   --  1.12  HEPARINUNFRC  --   --   --   --   --   --  0.57 0.56  --  0.30  CREATININE 2.01* 1.64*  < >  --   < > 1.41*  --   --  1.14 1.06  CKTOTAL 249*  --   --   --   --  889*  --   --   --   --   CKMB  --   --   --   --   --  49.7*  --   --   --   --   TROPONINI 0.42* 1.71*  --  11.22*  --  >20.00*  --   --   --   --   < > = values in this interval not displayed.  Estimated Creatinine Clearance: 51.8 ml/min (by C-G formula based on Cr of 1.06).  Assessment: [age] YOM s/p cath for elevated troponin found to have severe 3 vessel CAD but patent free RIMA-LAD, LIMA-diagonal, and SVG-PDA. S/p full dose heparin for NSTEMI. Now for medical management. Pharmacy asked to help convert to SQ heparin for VTE px. Hgb ok. plt down to 78 - watch.  Goal of Therapy:  VTE prophylaxis   Plan:  1. Heparin 5000 units SQ q8h 2. Will sign off from following heparin for VTE prophylaxis but will continue to follow patient for antibiotics 3. Will monitor plt closely  Christoper Fabianaron Verla Bryngelson, PharmD, BCPS Clinical pharmacist, pager [phone] 03/05/2014,5:38 PM

## 2014-03-06 DIAGNOSIS — R404 Transient alteration of awareness: Secondary | ICD-10-CM

## 2014-03-06 LAB — CBC WITH DIFFERENTIAL/PLATELET
BASOS ABS: 0 10*3/uL (ref 0.0–0.1)
Basophils Relative: 0 % (ref 0–1)
EOS PCT: 0 % (ref 0–5)
Eosinophils Absolute: 0 10*3/uL (ref 0.0–0.7)
HEMATOCRIT: 35.3 % — AB (ref 39.0–52.0)
HEMOGLOBIN: 11.7 g/dL — AB (ref 13.0–17.0)
Lymphocytes Relative: 8 % — ABNORMAL LOW (ref 12–46)
Lymphs Abs: 0.6 10*3/uL — ABNORMAL LOW (ref 0.7–4.0)
MCH: 29.5 pg (ref 26.0–34.0)
MCHC: 33.1 g/dL (ref 30.0–36.0)
MCV: 89.1 fL (ref 78.0–100.0)
MONO ABS: 0.2 10*3/uL (ref 0.1–1.0)
MONOS PCT: 3 % (ref 3–12)
NEUTROS ABS: 6.1 10*3/uL (ref 1.7–7.7)
Neutrophils Relative %: 89 % — ABNORMAL HIGH (ref 43–77)
Platelets: 52 10*3/uL — ABNORMAL LOW (ref 150–400)
RBC: 3.96 MIL/uL — ABNORMAL LOW (ref 4.22–5.81)
RDW: 14.8 % (ref 11.5–15.5)
WBC: 6.9 10*3/uL (ref 4.0–10.5)

## 2014-03-06 LAB — GLUCOSE, CAPILLARY
GLUCOSE-CAPILLARY: 167 mg/dL — AB (ref 70–99)
Glucose-Capillary: 147 mg/dL — ABNORMAL HIGH (ref 70–99)
Glucose-Capillary: 173 mg/dL — ABNORMAL HIGH (ref 70–99)
Glucose-Capillary: 175 mg/dL — ABNORMAL HIGH (ref 70–99)
Glucose-Capillary: 186 mg/dL — ABNORMAL HIGH (ref 70–99)
Glucose-Capillary: 190 mg/dL — ABNORMAL HIGH (ref 70–99)

## 2014-03-06 LAB — BASIC METABOLIC PANEL
BUN: 31 mg/dL — AB (ref 6–23)
CO2: 28 meq/L (ref 19–32)
Calcium: 7.5 mg/dL — ABNORMAL LOW (ref 8.4–10.5)
Chloride: 111 mEq/L (ref 96–112)
Creatinine, Ser: 0.95 mg/dL (ref 0.50–1.35)
GFR calc Af Amer: 87 mL/min — ABNORMAL LOW (ref >90)
GFR, EST NON AFRICAN AMERICAN: 75 mL/min — AB (ref >90)
GLUCOSE: 203 mg/dL — AB (ref 70–99)
Potassium: 2.8 mEq/L — CL (ref 3.7–5.3)
Sodium: 148 mEq/L — ABNORMAL HIGH (ref 137–147)

## 2014-03-06 LAB — MAGNESIUM: MAGNESIUM: 1.9 mg/dL (ref 1.5–2.5)

## 2014-03-06 LAB — PHOSPHORUS: Phosphorus: 1.1 mg/dL — ABNORMAL LOW (ref 2.3–4.6)

## 2014-03-06 LAB — CK TOTAL AND CKMB (NOT AT ARMC)
CK, MB: 5.8 ng/mL — ABNORMAL HIGH (ref 0.3–4.0)
RELATIVE INDEX: 2.7 — AB (ref 0.0–2.5)
Total CK: 214 U/L (ref 7–232)

## 2014-03-06 LAB — PROCALCITONIN: PROCALCITONIN: 0.26 ng/mL

## 2014-03-06 MED ORDER — FAMOTIDINE IN NACL 20-0.9 MG/50ML-% IV SOLN
20.0000 mg | Freq: Two times a day (BID) | INTRAVENOUS | Status: DC
  Administered 2014-03-06 – 2014-03-08 (×5): 20 mg via INTRAVENOUS
  Filled 2014-03-06 (×6): qty 50

## 2014-03-06 MED ORDER — POTASSIUM CHLORIDE 10 MEQ/50ML IV SOLN
10.0000 meq | INTRAVENOUS | Status: AC
  Administered 2014-03-06 (×6): 10 meq via INTRAVENOUS
  Filled 2014-03-06 (×4): qty 50

## 2014-03-06 MED ORDER — MAGNESIUM SULFATE 40 MG/ML IJ SOLN
2.0000 g | Freq: Once | INTRAMUSCULAR | Status: AC
  Administered 2014-03-06: 2 g via INTRAVENOUS
  Filled 2014-03-06: qty 50

## 2014-03-06 MED ORDER — POTASSIUM PHOSPHATE DIBASIC 3 MMOLE/ML IV SOLN
30.0000 mmol | Freq: Once | INTRAVENOUS | Status: AC
  Administered 2014-03-06: 30 mmol via INTRAVENOUS
  Filled 2014-03-06: qty 10

## 2014-03-06 MED ORDER — INSULIN ASPART 100 UNIT/ML ~~LOC~~ SOLN
0.0000 [IU] | SUBCUTANEOUS | Status: DC
Start: 2014-03-06 — End: 2014-03-07
  Administered 2014-03-06 – 2014-03-07 (×4): 3 [IU] via SUBCUTANEOUS
  Administered 2014-03-07: 2 [IU] via SUBCUTANEOUS

## 2014-03-06 NOTE — Progress Notes (Signed)
eLink Physician-Brief Progress Note Patient Name: [name] GustRobert Tally DOB: 08/03/1929 MRN: [id number]  Date of Service  03/06/2014   HPI/Events of Note  Hypokalemia and hypophospatemia   eICU Interventions  Potassium replaced via K Phos and Potassium runs.  Phos replaced with K Phos 30 mmol IV   Intervention Category Intermediate Interventions: Electrolyte abnormality - evaluation and management  DETERDING,ELIZABETH 03/06/2014, 4:52 AM

## 2014-03-06 NOTE — Progress Notes (Signed)
ANTIBIOTIC CONSULT NOTE - FOLLOW UP  Pharmacy Consult for Zosyn Indication: GNR bacteremia  No Known Allergies  Patient Measurements: Height: 5\' 9"  (175.3 cm) Weight: 153 lb (69.4 kg) IBW/kg (Calculated) : 70.7  Vital Signs: Temp: 97.8 F (36.6 C) (03/15 1137) Temp src: Oral (03/15 1137) BP: 132/60 mmHg (03/15 1100) Pulse Rate: 86 (03/15 1100) Intake/Output from previous day: 03/14 0701 - 03/15 0700 In: 3978.3 [I.V.:2223.3; NG/GT:400; IV Piggyback:1235] Out: 1600 [Urine:1600] Intake/Output from this shift: Total I/O In: 650 [I.V.:500; IV Piggyback:150] Out: 340 [Urine:340]  Labs:  Recent Labs  03/04/14 0515 03/04/14 1500 03/05/14 0445 03/06/14 0330  WBC 5.4  --  10.9* 6.9  HGB 12.0*  --  12.4* 11.7*  PLT 77*  --  78* 52*  CREATININE  --  1.14 1.06 0.95   Estimated Creatinine Clearance: 58.8 ml/min (by C-G formula based on Cr of 0.95). No results found for this basename: VANCOTROUGH, Leodis Binet, VANCORANDOM, GENTTROUGH, GENTPEAK, GENTRANDOM, TOBRATROUGH, TOBRAPEAK, TOBRARND, AMIKACINPEAK, AMIKACINTROU, AMIKACIN,  in the last 72 hours   Microbiology: Recent Results (from the past 720 hour(s))  URINE CULTURE     Status: None   Collection Time    03/03/14  5:21 PM      Result Value Ref Range Status   Specimen Description URINE, CATHETERIZED   Final   Special Requests NONE   Final   Culture  Setup Time     Final   Value: 03/03/2014 18:57     Performed at [facility] Count     Final   Value: NO GROWTH     Performed at [facility]   Culture     Final   Value: NO GROWTH     Performed at [facility]   Report Status 03/04/2014 FINAL   Final  CULTURE, BLOOD (ROUTINE X 2)     Status: None   Collection Time    03/03/14  6:40 PM      Result Value Ref Range Status   Specimen Description BLOOD HAND RIGHT   Final   Special Requests BOTTLES DRAWN AEROBIC AND ANAEROBIC 5CCBLUE 4CCRED   Final   Culture  Setup Time     Final   Value:  03/03/2014 21:58     Performed at [facility]   Culture     Final   Value:        BLOOD CULTURE RECEIVED NO GROWTH TO DATE CULTURE WILL BE HELD FOR 5 DAYS BEFORE ISSUING A FINAL NEGATIVE REPORT     Performed at [facility]   Report Status PENDING   Incomplete  CULTURE, BLOOD (ROUTINE X 2)     Status: None   Collection Time    03/03/14  6:52 PM      Result Value Ref Range Status   Specimen Description BLOOD HAND LEFT   Final   Special Requests BOTTLES DRAWN AEROBIC AND ANAEROBIC 5CC   Final   Culture  Setup Time     Final   Value: 03/03/2014 22:01     Performed at [facility]   Culture     Final   Value: GRAM NEGATIVE RODS     Note: Gram Stain Report Called to,Read Back By and Verified With: PORTER RN AT [phone] 03/05/14 BY SNOLO     Performed at [facility]   Report Status PENDING   Incomplete  MRSA PCR SCREENING     Status: None   Collection Time  03/03/14 10:04 PM      Result Value Ref Range Status   MRSA by PCR NEGATIVE  NEGATIVE Final   Comment:            The GeneXpert MRSA Assay (FDA     approved for NASAL specimens     only), is one component of a     comprehensive MRSA colonization     surveillance program. It is not     intended to diagnose MRSA     infection nor to guide or     monitor treatment for     MRSA infections.    Anti-infectives   Start     Dose/Rate Route Frequency Ordered Stop   03/04/14 1800  vancomycin (VANCOCIN) IVPB 750 mg/150 ml premix  Status:  Discontinued     750 mg 150 mL/hr over 60 Minutes Intravenous Every 24 hours 03/03/14 2131 03/05/14 1427   03/04/14 0100  piperacillin-tazobactam (ZOSYN) IVPB 3.375 g     3.375 g 12.5 mL/hr over 240 Minutes Intravenous 3 times per day 03/03/14 2131     03/03/14 1800  vancomycin (VANCOCIN) IVPB 1000 mg/200 mL premix     1,000 mg 200 mL/hr over 60 Minutes Intravenous  Once 03/03/14 1749 03/03/14 2058   03/03/14 1800  piperacillin-tazobactam (ZOSYN) IVPB 3.375 g      3.375 g 100 mL/hr over 30 Minutes Intravenous  Once 03/03/14 1750 03/03/14 1931      Assessment: 80 y.o. male continues on Zosyn (Day #4) for GNR bacteremia. WBC down to nl. Afeb. PCT down to 0.26. LA 1.6. SCr down to nl, est CrCl 60 ml/min.  Vanc 3/12 >>3/14 Zosyn 3/12 >>  3/12 Blood >>1/2 GNR 3/12 Urine >>neg 3/12 MRSA neg  Goal of Therapy:  Resolution of infection  Plan:  1. Continue Zosyn 3.375gm IV q8h (extended interval dosing) 2. Will f/u micro data, renal function, pt's clinical condition  Christoper Fabianaron Gabor Lusk, PharmD, BCPS Clinical pharmacist, pager [phone]  03/06/2014,2:19 PM

## 2014-03-06 NOTE — Progress Notes (Signed)
Elink MD notified that pt has pulled out his NG tube. 2 nurses have tried to replace NG tube but have been unsuccessful replacing the NG tube.   Will report off to shift in the am. Will continue to monitor.

## 2014-03-06 NOTE — Progress Notes (Signed)
PULMONARY / CRITICAL CARE MEDICINE   Name: [name] GustRobert Bloxham MRN: [id number] DOB: 07/09/1929    ADMISSION DATE:  03/03/2014  REFERRING MD :  EDP PRIMARY SERVICE: PCCM  CHIEF COMPLAINT:  Found down  BRIEF PATIENT DESCRIPTION:    80 yo male with hx HTN, DM, CAD, MI presented 3/12 after being found unresponsive.  Pt last seen ~5 days prior to admit by family at which time he was reportedly confused, not himself.  Neighbors called EMS r/t multiple newspapers outside.  Unresponsive, hypotensive, hypothermic on EMS arrival, found in urine and fesces with multiple pressure ulcers.  ?DKA. BP 80's in ER despite 3L fluids, hypothermic with temp 91.     LINES / TUBES: L IJ CVL 3/12 >>> Foley 3/12 >>>  CULTURES: 3/12  Blood >>> GNR 3/12  Urine >>> 3/12 MRSA PCR - negative      ANTIBIOTICS: Vancomycin 3/12>>>3/15 Zosyn 3/12 >>>    SIGNIFICANT EVENTS / STUDIES:  3/12  Head / c-spine CT >>> nad  03/04/14: Not intubated. On 7mcg levophed. Drowsy, ? Cheyne stokes breathing but did follow commands and moved all 4s to RN.  Metabolic acidosis on aBG. Trop > 20 ; on IV heparin. Cath - clean. LVEDP low  03/05/14 Remains unintubated. Off pressors x 4h. Drowsy but more responsive than yesterday: says he is "I am better"   SUBJECTIVE/OVERNIGHT/INTERVAL HX 03/06/14: off pressors x > 24h. More awake but still sleepy. Oriented x 1-2. DEnies tenderness in c spine  VITAL SIGNS: Temp:  [97.5 F (36.4 C)-98.7 F (37.1 C)] 97.8 F (36.6 C) (03/15 1137) Pulse Rate:  [27-86] 86 (03/15 1100) Resp:  [9-29] 23 (03/15 1100) BP: (83-149)/(40-70) 132/60 mmHg (03/15 1100) SpO2:  [93 %-100 %] 100 % (03/15 1100) Weight:  [69.4 kg (153 lb)] 69.4 kg (153 lb) (03/15 0418)  HEMODYNAMICS: CVP:  [2 mmHg] 2 mmHg VENTILATOR SETTINGS:   INTAKE / OUTPUT: Intake/Output     03/14 0701 - 03/15 0700 03/15 0701 - 03/16 0700   I.V. (mL/kg) 2223.3 (32) 500 (7.2)   Other 120    NG/GT 400    IV Piggyback 1235 150    Total Intake(mL/kg) 3978.3 (57.3) 650 (9.4)   Urine (mL/kg/hr) 1600 (1) 340 (0.7)   Total Output 1600 340   Net +2378.3 +310        Stool Occurrence 3 x      PHYSICAL EXAMINATION: General:  Frail, thin, chronically ill appearing male, dirty, disheveled  Neuro:  Lethargic RASS-2 equivalent. Small bruise to left fronto-temp area, But can sustain and follow some command when woken up HEENT:  Mm dry, pale, no JVD, cervical collar (palpated c spine - denies tenderness) Cardiovascular:  s1s2 rrr, sternotomy scar  Lungs:  RR 15.  Abdomen:  Soft, nt, +bs Musculoskeletal:  Pale, multiple small pressure ulcers,  + edema bilaterally  LABS:  PULMONARY  Recent Labs Lab 03/03/14 1526 03/03/14 1759 03/03/14 1958 03/03/14 2038 03/04/14 0937 03/04/14 1002  PHART 7.217* 7.047*  --  7.183*  --  7.394  PCO2ART 23.2* 31.4*  --  34.7*  --  41.3  PO2ART 432.0* 84.0  --  451.0*  --  136.0*  HCO3 9.4* 8.6* 11.4* 13.0*  --  25.3*  TCO2 10 10 12 14   --  27  O2SAT 100.0 90.0 79.0 100.0 73.5 99.0    CBC  Recent Labs Lab 03/04/14 0515 03/05/14 0445 03/06/14 0330  HGB 12.0* 12.4* 11.7*  HCT 36.1* 37.7* 35.3*  WBC 5.4 10.9*  6.9  PLT 77* 78* 52*    COAGULATION  Recent Labs Lab 03/03/14 1446 03/05/14 0445  INR 1.05 1.12    CARDIAC    Recent Labs Lab 03/03/14 1446 03/03/14 1629 03/03/14 2221 03/04/14 0500  TROPONINI 0.42* 1.71* 11.22* >20.00*    Recent Labs Lab 03/05/14 0445  PROBNP 6895.0*     CHEMISTRY  Recent Labs Lab 03/03/14 1446 03/03/14 1629  03/04/14 03/04/14 0500 03/04/14 1500 03/05/14 0445 03/06/14 0330  NA 154* 154*  < > 159* 159* 156* 157* 148*  K 5.3 5.3  < > 4.0 3.7 3.6* 3.0* 2.8*  CL 101 112  < > 119* 121* 120* 120* 111  CO2 9* 8*  < > 19 23 24 27 28   GLUCOSE 640* 500*  < > 325* 165* 244* 145* 203*  BUN 127* 104*  < > 102* 87* 68* 48* 31*  CREATININE 2.01* 1.64*  < > 1.68* 1.41* 1.14 1.06 0.95  CALCIUM 9.3 7.9*  < > 7.9* 7.6* 7.4* 7.7* 7.5*   MG  --  2.3  --   --   --   --  1.7 1.9  PHOS  --  4.0  --   --   --   --  0.8* 1.1*  < > = values in this interval not displayed. Estimated Creatinine Clearance: 58.8 ml/min (by C-G formula based on Cr of 0.95).   LIVER  Recent Labs Lab 03/03/14 1446 03/04/14 0500 03/05/14 0445  AST 16 51* 42*  ALT 18 20 22   ALKPHOS 73 50 65  BILITOT 0.7 0.4 0.4  PROT 5.0* 3.8* 4.1*  ALBUMIN 2.6* 1.8* 1.8*  INR 1.05  --  1.12     INFECTIOUS  Recent Labs Lab 03/03/14 1447 03/03/14 1629 03/04/14 0500 03/05/14 0445 03/05/14 0446 03/06/14 0330  LATICACIDVEN 3.4* 1.8  --  1.6  --   --   PROCALCITON  --   --  1.09  --  0.88 0.26    Recent Labs Lab 03/04/14 0500 03/05/14 0446 03/06/14 0330  PROCALCITON 1.09 0.88 0.26     ENDOCRINE CBG (last 3)   Recent Labs  03/06/14 0337 03/06/14 0711 03/06/14 1101  GLUCAP 173* 175* 186*         IMAGING x48h  Dg Abd Portable 1v  03/05/2014   CLINICAL DATA:  Nasogastric tube placement  EXAM: PORTABLE ABDOMEN - 1 VIEW  COMPARISON:  None.  FINDINGS: Nasogastric tube tip and side port are in the stomach. Visualized bowel gas pattern is unremarkable. No obstruction or free air is seen on this supine examination. There is calcification to the left of L2 of uncertain etiology. There is arthropathy in the lumbar spine.  IMPRESSION: Nasogastric tube tip and side port in stomach. Bowel gas pattern nonspecific. Calcification to the left of L2 of uncertain etiology.   Electronically Signed   By: Bretta Bang M.D.   On: 03/05/2014 19:32      ASSESSMENT / PLAN:  PULMONARY A: Protects airway at this time At risk for aspiration At risk for respiratory failure due to high metabolic demand   - improved lethargy P:   Goal SpO2>92 Supplemental oxygen BiPAP contraindicated   CARDIOVASCULAR A: SIRS / Sepsis Hx HTN, CAD, MI, s/p CABG   - Definite NSTEMI. Cath clean. S/p IV heparin. Off pressors  X 24h P:  Goal  MAP>65   RENAL A: Acute renal failure  Hypernatremia    - resolved , unable to give free water via  NG. Low K, low phos - repleted by elink. Stil wil mild low mag P:   Replete mag Reduce d5 water to 50cc/h from 100cc/h Goal CVP 8-10 Trend BMP   GASTROINTESTINAL A: Nutrition GIPx   - pulled ng tube out. Has diarrhea x 1 day; hs flexiseal  P:   NPO till more awake Monitor without ng tube DC Protonix, start H2 blocker -> if diarrhea still + consider workup   HEMATOLOGIC A: Thrombocytopenia VTE Px   - 4T score is 3 and low risk for HIT.Platelet count 50K likely due to sepsis  P:  Trend CBC Hold heparin  Monitor  INFECTIOUS A: Suspected sepsis: GNR bactermemia P:   DC vanc Continue zosyn; narrow when ID available Cx / abx as above   ENDOCRINE A: DM  DKA  Unknown adrenal function: cortisol 34.5   - on phase 3 hyperglycemia protocol with lantus and NPO P:   Dc lantus Start ssi q4h No role for steroids  NEUROLOGIC A: Chronic cervical stenosis - admit placed on C collar due to being found down Acute encephalopathy likely metabolic   - improving slowly. No C spine tenderness  P:   C-collar for now (has c spine chronic sternosis)   But  will dc when more awake and more confident definite no tenderness Monitor   GLOBAL 03/06/14: move to floor TRH pick up. D/w Dr Butler Denmark    Dr. Kalman Shan, M.D., [facility].C.P Pulmonary and Critical Care Medicine Staff Physician [facility] Pager: [phone], If no answer or between  15:00h - 7:00h: call [phone]  03/06/2014 2:04 PM

## 2014-03-06 NOTE — Progress Notes (Signed)
CRITICAL VALUE ALERT  Critical value received:  Potassium 2.8  Date of notification:  03/06/14  Time of notification:  0453  Critical value read back:yes  Nurse who received alert:  Tory EmeraldMichelle Adelis Docter, RN  MD notified (1st page):  Dr Deterding  Time of first page:  [phone]  MD notified (2nd page):  Time of second page:  Responding MD:  Dr Darrick Pennaeterding  Time MD responded:  [phone]

## 2014-03-07 DIAGNOSIS — A419 Sepsis, unspecified organism: Secondary | ICD-10-CM | POA: Diagnosis present

## 2014-03-07 LAB — CBC WITH DIFFERENTIAL/PLATELET
Basophils Absolute: 0 10*3/uL (ref 0.0–0.1)
Basophils Relative: 0 % (ref 0–1)
Eosinophils Absolute: 0 10*3/uL (ref 0.0–0.7)
Eosinophils Relative: 0 % (ref 0–5)
HCT: 40.2 % (ref 39.0–52.0)
Hemoglobin: 13.3 g/dL (ref 13.0–17.0)
Lymphocytes Relative: 7 % — ABNORMAL LOW (ref 12–46)
Lymphs Abs: 0.4 10*3/uL — ABNORMAL LOW (ref 0.7–4.0)
MCH: 29.6 pg (ref 26.0–34.0)
MCHC: 33.1 g/dL (ref 30.0–36.0)
MCV: 89.3 fL (ref 78.0–100.0)
Monocytes Absolute: 0.2 10*3/uL (ref 0.1–1.0)
Monocytes Relative: 3 % (ref 3–12)
Neutro Abs: 5.6 10*3/uL (ref 1.7–7.7)
Neutrophils Relative %: 90 % — ABNORMAL HIGH (ref 43–77)
Platelets: 44 10*3/uL — ABNORMAL LOW (ref 150–400)
RBC: 4.5 MIL/uL (ref 4.22–5.81)
RDW: 14.4 % (ref 11.5–15.5)
WBC: 6.2 10*3/uL (ref 4.0–10.5)

## 2014-03-07 LAB — CULTURE, BLOOD (ROUTINE X 2)

## 2014-03-07 LAB — BASIC METABOLIC PANEL
BUN: 18 mg/dL (ref 6–23)
CALCIUM: 7.7 mg/dL — AB (ref 8.4–10.5)
CO2: 28 meq/L (ref 19–32)
Chloride: 104 mEq/L (ref 96–112)
Creatinine, Ser: 0.72 mg/dL (ref 0.50–1.35)
GFR calc non Af Amer: 85 mL/min — ABNORMAL LOW (ref >90)
Glucose, Bld: 185 mg/dL — ABNORMAL HIGH (ref 70–99)
Potassium: 3.2 mEq/L — ABNORMAL LOW (ref 3.7–5.3)
SODIUM: 143 meq/L (ref 137–147)

## 2014-03-07 LAB — GLUCOSE, CAPILLARY
GLUCOSE-CAPILLARY: 185 mg/dL — AB (ref 70–99)
GLUCOSE-CAPILLARY: 220 mg/dL — AB (ref 70–99)
GLUCOSE-CAPILLARY: 174 mg/dL — AB (ref 70–99)
Glucose-Capillary: 151 mg/dL — ABNORMAL HIGH (ref 70–99)
Glucose-Capillary: 155 mg/dL — ABNORMAL HIGH (ref 70–99)
Glucose-Capillary: 164 mg/dL — ABNORMAL HIGH (ref 70–99)

## 2014-03-07 LAB — PHOSPHORUS: PHOSPHORUS: 1.5 mg/dL — AB (ref 2.3–4.6)

## 2014-03-07 LAB — MAGNESIUM: Magnesium: 1.8 mg/dL (ref 1.5–2.5)

## 2014-03-07 MED ORDER — INSULIN ASPART 100 UNIT/ML ~~LOC~~ SOLN
0.0000 [IU] | Freq: Three times a day (TID) | SUBCUTANEOUS | Status: DC
  Administered 2014-03-07 – 2014-03-08 (×5): 5 [IU] via SUBCUTANEOUS

## 2014-03-07 MED ORDER — SODIUM CHLORIDE 0.9 % IJ SOLN
10.0000 mL | INTRAMUSCULAR | Status: DC | PRN
  Administered 2014-03-07 – 2014-03-11 (×6): 10 mL
  Administered 2014-03-12 (×2): 20 mL
  Administered 2014-03-13 – 2014-03-14 (×6): 10 mL

## 2014-03-07 MED ORDER — POTASSIUM PHOSPHATE DIBASIC 3 MMOLE/ML IV SOLN
30.0000 mmol | Freq: Two times a day (BID) | INTRAVENOUS | Status: AC
  Administered 2014-03-07 (×2): 30 mmol via INTRAVENOUS
  Filled 2014-03-07 (×3): qty 10

## 2014-03-07 MED ORDER — ENOXAPARIN SODIUM 40 MG/0.4ML ~~LOC~~ SOLN
40.0000 mg | SUBCUTANEOUS | Status: DC
  Administered 2014-03-07 – 2014-03-08 (×2): 40 mg via SUBCUTANEOUS
  Filled 2014-03-07 (×3): qty 0.4

## 2014-03-07 MED ORDER — INFLUENZA VAC SPLIT QUAD 0.5 ML IM SUSP
0.5000 mL | INTRAMUSCULAR | Status: AC
  Administered 2014-03-07: 0.5 mL via INTRAMUSCULAR
  Filled 2014-03-07: qty 0.5

## 2014-03-07 MED ORDER — LEVOFLOXACIN IN D5W 750 MG/150ML IV SOLN
750.0000 mg | INTRAVENOUS | Status: AC
  Administered 2014-03-07 – 2014-03-10 (×4): 750 mg via INTRAVENOUS
  Filled 2014-03-07 (×4): qty 150

## 2014-03-07 MED ORDER — PNEUMOCOCCAL VAC POLYVALENT 25 MCG/0.5ML IJ INJ
0.5000 mL | INJECTION | INTRAMUSCULAR | Status: AC
  Administered 2014-03-07: 0.5 mL via INTRAMUSCULAR
  Filled 2014-03-07: qty 0.5

## 2014-03-07 NOTE — Care Management Note (Signed)
    Page 1 of 2   03/09/2014     4:09:48 PM   CARE MANAGEMENT NOTE 03/09/2014  Patient:  [name] [name]   Account Number:  [id number]  Date Initiated:  03/04/2014  Documentation initiated by:  [name] [name] [name] [name]  Subjective/Objective Assessment:   Found down - hypothermic, hypotensive. on pressors.     Action/Plan:   pt/ot eval-   Anticipated DC Date:  03/08/2014   Anticipated DC Plan:  SKILLED NURSING FACILITY  In-house referral  Clinical Social Worker      DC Planning Services  CM consult      Choice offered to / List presented to:             Status of service:  In process, will continue to follow Medicare Important Message given?   (If response is "NO", the following Medicare IM given date fields will be blank) Date Medicare IM given:   Date Additional Medicare IM given:    Discharge Disposition:    Per UR Regulation:  Reviewed for med. necessity/level of care/duration of stay  If discussed at Long Length of Stay Meetings, dates discussed:   03/08/2014    Comments:  Contact:  [name] [name]     [phone]   Nephew,[name] [name] Brother [phone]   [name] [name] Daughter [phone] - expired on 3-17   1/2 sister [name] [name] - [phone]  03-09-14 4pm [name] [name], RNBSN [phone] Talked with sister [name] [name] at bedside.  States daughter died yesterday.  [name] is only one that may know anything about patient and wont be available till after the funeral on Friday. [name] [name] is 1/2 sister.  Patient has a twin brother that has shingles and cannot get out of house.,  Does not know why son has not been here?? At this point she is best contact.  States patient does not know daughter died.  [name] [name] states [name] [name] called her about possible placement but she cannot give any approval for anything - [name] will need to do.  03/07/14 1657 [name] Capeeborah Taylor RN, BSN [phone] await pt/ot eval. Most likely will need snf.  NCM called patient's grand daughter , [name] HuskyCassandra [phone]   [name], she states she would be the contact person for patient but he does not have a poa, she agrees that patient can not go back to living by himself.  Informed her physical therapy will see him and make recommendations and we will be in contact with her.

## 2014-03-07 NOTE — Progress Notes (Signed)
Patient: [name] [name] / Admit Date: 03/03/2014 / Date of Encounter: 03/07/2014, 9:28 AM  Subjective  His back is bothering him slightly. No CP or SOB.  Awaiting swallow eval to clear for orals.  Objective   Telemetry: Sinus brady 50's yesterday evening. This AM NSR with occ PVCs  Physical Exam: Blood pressure 124/60, pulse 62, temperature 98.3 F (36.8 C), temperature source Oral, resp. rate 18, height 5\' 9"  (1.753 m), weight 165 lb 3.2 oz (74.934 kg), SpO2 98.00%. General: Well developed, well nourished frail elderly WM in no acute distress. Head: Normocephalic, atraumatic, sclera non-icteric, no xanthomas, nares are without discharge. Neck: JVP not elevated. Lungs: Clear bilaterally to auscultation without wheezes, rales, or rhonchi. Breathing is unlabored. Heart: RRR S1 S2 occasional ectopy without murmurs, rubs, or gallops.  Abdomen: Soft, non-tender, non-distended with normoactive bowel sounds. No rebound/guarding. Extremities: No clubbing or cyanosis. 1+ BLE edema. Neuro: Alert and oriented to self, March. [place] Spaniel it was 69 and he is in a home on [address] Moves all extremities spontaneously. Psych:  Responds to questions appropriately with a normal affect with exception of orientation status.   Intake/Output Summary (Last 24 hours) at 03/07/14 0928 Last data filed at 03/07/14 0600  Gross per 24 hour  Intake 1419.17 ml  Output   2410 ml  Net -990.83 ml    Inpatient Medications:  . antiseptic oral rinse  15 mL Mouth Rinse q12n4p  . aspirin  300 mg Rectal Daily  . chlorhexidine  15 mL Mouth Rinse BID  . famotidine (PEPCID) IV  20 mg Intravenous Q12H  . influenza vac split quadrivalent PF  0.5 mL Intramuscular Tomorrow-1000  . insulin aspart  0-15 Units Subcutaneous 6 times per day  . piperacillin-tazobactam (ZOSYN)  IV  3.375 g Intravenous 3 times per day  . pneumococcal 23 valent vaccine  0.5 mL Intramuscular Tomorrow-1000  . potassium phosphate IVPB (mmol)  30 mmol  Intravenous BID   Infusions:  . dextrose 50 mL/hr at 03/06/14 1423  . norepinephrine (LEVOPHED) Adult infusion Stopped (03/05/14 1113)    Labs:  Recent Labs  03/06/14 0330 03/07/14 0519  NA 148* 143  K 2.8* 3.2*  CL 111 104  CO2 28 28  GLUCOSE 203* 185*  BUN 31* 18  CREATININE 0.95 0.72  CALCIUM 7.5* 7.7*  MG 1.9 1.8  PHOS 1.1* 1.5*    Recent Labs  03/05/14 0445  AST 42*  ALT 22  ALKPHOS 65  BILITOT 0.4  PROT 4.1*  ALBUMIN 1.8*    Recent Labs  03/06/14 0330 03/07/14 0519  WBC 6.9 6.2  NEUTROABS 6.1 5.6  HGB 11.7* 13.3  HCT 35.3* 40.2  MCV 89.1 89.3  PLT 52* 44*    Recent Labs  03/06/14 0330  CKTOTAL 214  CKMB 5.8*   No components found with this basename: POCBNP,  No results found for this basename: HGBA1C,  in the last 72 hours   Radiology/Studies:  Ct Head Wo Contrast  03/03/2014   CLINICAL DATA:  Confused, unresponsive  EXAM: CT HEAD WITHOUT CONTRAST  CT CERVICAL SPINE WITHOUT CONTRAST  TECHNIQUE: Multidetector CT imaging of the head and cervical spine was performed following the standard protocol without intravenous contrast. Multiplanar CT image reconstructions of the cervical spine were also generated.  COMPARISON:  None.  FINDINGS: CT HEAD FINDINGS  There is no evidence of mass effect, midline shift, or extra-axial fluid collections. There is no evidence of a space-occupying lesion or intracranial hemorrhage. There is no evidence of  a cortical-based area of acute infarction. There is generalized cerebral atrophy. There is periventricular white matter low attenuation likely secondary to microangiopathy.  The ventricles and sulci are appropriate for the patient's age. The basal cisterns are patent.  Visualized portions of the orbits are unremarkable. The visualized portions of the paranasal sinuses and mastoid air cells are unremarkable. Cerebrovascular atherosclerotic calcifications are noted.  The osseous structures are unremarkable.  CT CERVICAL  SPINE FINDINGS  The alignment is anatomic. The vertebral body heights are maintained. There is no acute fracture. There is no static listhesis. The prevertebral soft tissues are normal. The intraspinal soft tissues are not fully imaged on this examination due to poor soft tissue contrast, but there is no gross soft tissue abnormality.  There is degenerative disc disease throughout the cervical spine. There broad-based disc bulges at C2-3, C3-4, C4-5, C5-6 and C6-7. There is bilateral facet arthropathy extending from C3-4 through C7-T1. There is bilateral foraminal stenosis at C5-6 and C6-7. Marland Kitchen. There is unilateral right foraminal stenosis at C3-4 and C4-5.  There is left carotid artery atherosclerosis. There is evidence of prior right carotid endarterectomy.  IMPRESSION: 1. No acute intracranial pathology. 2. No acute osseous injury of the cervical spine. 3. Diffuse cervical spine spondylosis as described above.   Electronically Signed   By: Elige KoHetal  Patel   On: 03/03/2014 16:00   Ct Cervical Spine Wo Contrast  03/03/2014   CLINICAL DATA:  Confused, unresponsive  EXAM: CT HEAD WITHOUT CONTRAST  CT CERVICAL SPINE WITHOUT CONTRAST  TECHNIQUE: Multidetector CT imaging of the head and cervical spine was performed following the standard protocol without intravenous contrast. Multiplanar CT image reconstructions of the cervical spine were also generated.  COMPARISON:  None.  FINDINGS: CT HEAD FINDINGS  There is no evidence of mass effect, midline shift, or extra-axial fluid collections. There is no evidence of a space-occupying lesion or intracranial hemorrhage. There is no evidence of a cortical-based area of acute infarction. There is generalized cerebral atrophy. There is periventricular white matter low attenuation likely secondary to microangiopathy.  The ventricles and sulci are appropriate for the patient's age. The basal cisterns are patent.  Visualized portions of the orbits are unremarkable. The visualized  portions of the paranasal sinuses and mastoid air cells are unremarkable. Cerebrovascular atherosclerotic calcifications are noted.  The osseous structures are unremarkable.  CT CERVICAL SPINE FINDINGS  The alignment is anatomic. The vertebral body heights are maintained. There is no acute fracture. There is no static listhesis. The prevertebral soft tissues are normal. The intraspinal soft tissues are not fully imaged on this examination due to poor soft tissue contrast, but there is no gross soft tissue abnormality.  There is degenerative disc disease throughout the cervical spine. There broad-based disc bulges at C2-3, C3-4, C4-5, C5-6 and C6-7. There is bilateral facet arthropathy extending from C3-4 through C7-T1. There is bilateral foraminal stenosis at C5-6 and C6-7. Marland Kitchen. There is unilateral right foraminal stenosis at C3-4 and C4-5.  There is left carotid artery atherosclerosis. There is evidence of prior right carotid endarterectomy.  IMPRESSION: 1. No acute intracranial pathology. 2. No acute osseous injury of the cervical spine. 3. Diffuse cervical spine spondylosis as described above.   Electronically Signed   By: Elige KoHetal  Patel   On: 03/03/2014 16:00   Dg Chest Port 1 View  03/04/2014   CLINICAL DATA:  Hypoxia.  EXAM: PORTABLE CHEST - 1 VIEW  COMPARISON:  03/03/2014  FINDINGS: The left subclavian central line still extends along  the left side of the heart and mediastinum. Similar to the previous chest radiograph reports, this could be within a persistent left SVC but cannot exclude intra-arterial placement. No evidence for a pneumothorax. Stable appearance of the heart and mediastinum with median sternotomy wires. Lungs are clear without airspace disease or pulmonary edema.  IMPRESSION: Abnormal position of the left subclavian central line as described.  No focal chest disease.   Electronically Signed   By: Richarda Overlie M.D.   On: 03/04/2014 08:16   Dg Chest Portable 1 View  03/03/2014   CLINICAL DATA:   Altered mental status.  EXAM: PORTABLE CHEST - 1 VIEW  COMPARISON:  March 03, 2014.  FINDINGS: Stable cardiomediastinal silhouette. Status post coronary artery bypass graft. No pneumothorax or pleural effusion is noted. No acute pulmonary disease is noted. Left internal jugular catheter has been removed and replaced with left subclavian catheter; this catheter also projects over the left side of the mediastinum. This either represents intra-arterial position or congenital venous variant such as left-sided superior vena cava. Bony thorax is intact.  IMPRESSION: No acute cardiopulmonary abnormality seen. Left internal jugular catheter has been removed and replaced with left subclavian catheter, which also demonstrates left-sided position over mediastinum. As described above, this represents either intra-arterial position or congenital venous variant such as left-sided superior vena cava.   Electronically Signed   By: Roque Lias M.D.   On: 03/03/2014 18:39   Dg Chest Port 1 View  03/03/2014   CLINICAL DATA:  Status post line placement.  EXAM: PORTABLE CHEST - 1 VIEW  COMPARISON:  None.  FINDINGS: There is a left internal jugular central venous catheter with the tip projecting over the left aspect of the mediastinum. Patient is rotated, limiting evaluation. Grossly unremarkable mediastinal contours given the patient rotation. Patient status post median sternotomy and CABG procedure. The lungs are well aerated. No large consolidative pulmonary opacities. No pleural effusion or pneumothorax. The left costophrenic angle is excluded from view. Regional skeleton is unremarkable.  IMPRESSION:AAAA: IMPRESSION:AAAA Left internal jugular central venous catheter tip projects over the left aspect of the mediastinum. This is concerning for intra-arterial line placement in the absence of known variant anatomy such as left-sided superior vena cava. Recommend clinical correlation, potentially with arterial blood gas. Additionally  followup chest radiograph with repositioning is recommended.  Critical Value/emergent results were called by telephone at the time of interpretation on 03/03/2014 at 5:34 PM to Dr. Marin Shutter, who verbally acknowledged these results.   Electronically Signed   By: Annia Belt M.D.   On: 03/03/2014 17:44   Dg Abd Portable 1v  03/05/2014   CLINICAL DATA:  Nasogastric tube placement  EXAM: PORTABLE ABDOMEN - 1 VIEW  COMPARISON:  None.  FINDINGS: Nasogastric tube tip and side port are in the stomach. Visualized bowel gas pattern is unremarkable. No obstruction or free air is seen on this supine examination. There is calcification to the left of L2 of uncertain etiology. There is arthropathy in the lumbar spine.  IMPRESSION: Nasogastric tube tip and side port in stomach. Bowel gas pattern nonspecific. Calcification to the left of L2 of uncertain etiology.   Electronically Signed   By: Bretta Bang M.D.   On: 03/05/2014 19:32     Assessment and Plan  1.  Septic/vasodilatory shock with GNR bacteremia (enterobacter aerogenes) and DKA - off pressors. Improving. ?Source. On abx. 2. NSTEMI secondary to type II demand ischemia from sepsis with troponin > 20. H/o remote MI and CABG. Cath  Cath 3/13 showed severe native 3 vessel CAD with total occlusion of the LM and RCA , patency of the free RIMA-LAD, LIMA-diagonal, and SVG-PDA, total occlusion of the SVG-LCx (appears chronic) and low LVEDP most consistent with septic/vasodiliatory shock. EF preserved by echo. Medical management of CAD recommended. Continue ASA with cautious eye on platelets. If BP continues to remain stable we can think about adding low dose beta blocker. HR was in the 50s yesterday PM but is in the 80s today. Recommend to add statin when taking POs -await IM input on when he's ready for diet. 3. Acute encephalopathy with underlying dementia - improving but not fully oriented. Was living independently PTA. Will likely need some sort of rehab  following discharge. 4. Thrombocytopenia - PCCM felt due to sepsis rather than HIT. Will need to follow while on aspirin.  5. Dyslipidemia - consider statin when taking orals. 6. Hypokalemia/hypernatremia - primary team repleting potassium. Hypokalemia may be contributing to PVCs. HyperNa+ resolved. 7. Lower extremity edema - will ask MD to weigh in on possible need for diuresis for volume overload. BLE edema on exam, weight up 23 lbs from admission but was also likely very dehydrated on admission. Baseline weight not clear.  Signed, Ronie Spies PA-C Agree with above. Patient is lying flat and no dyspnea. There is mild ankle edema some of which may be nutritional with albumen 1.8.  Would hold off on diuretic at present time.

## 2014-03-07 NOTE — Evaluation (Signed)
Physical Therapy Evaluation Patient Details Name: [name] [name] MRN: [id number] DOB: Apr 02, 1929 Today's Date: 03/07/2014 Time: 4098-1191 PT Time Calculation (min): 20 min  PT Assessment / Plan / Recommendation History of Present Illness  80 y.o. male admitted to [facility] on 03/03/14 with 80 yo male with hx HTN, DM, CAD, MI presented 3/12 after being found unresponsive.  Pt last seen ~5 days prior to admit by family at which time he was reportedly confused, not himself.  Neighbors called EMS d/t multiple newspapers outside.  Unresponsive, hypotensive, hypothermic on EMS arrival, found in urine and fesces with multiple pressure ulcers.  ?DKA. BP 80's in ER despite 3L fluids, hypothermic with temp 91.   The pt was dx with septic shock, acute renal failure, DKA, CAD/NSTEMI with h/o CABG.  Cath 3/13 showed severe native 3 vessel CAD with total occlusion of the LM and RCA , patency of the free RIMA-LAD, LIMA-diagonal, and SVG-PDA, total occlusion of the SVG-LCx (appears chronic) and low LVEDP most consistent with septic/vasodiliatory shock.  He also has a contusion on the left side of his forhead.    Clinical Impression   The pt is very weak and deconditioned and is currently requiring total assist to even sit EOB.  He was tachy with rates into the 140s just attempting to sit on the side of the bed.  We could not even attempt standing.  He would benefit from SNF placement at d/c because between his physical immobility and his cognition he is not safe to return home at this time.   PT to follow acutely for deficits listed below.       PT Assessment  Patient needs continued PT services    Follow Up Recommendations  SNF    Does the patient have the potential to tolerate intense rehabilitation     NA  Barriers to Discharge Decreased caregiver support pt lives alone    Equipment Recommendations  Rolling walker with 5" wheels;Wheelchair (measurements PT);Wheelchair cushion (measurements PT);Hospital bed;Other  (comment) (hoyer lift)    Recommendations for Other Services   None  Frequency Min 2X/week    Precautions / Restrictions Precautions Precautions: Fall Precaution Comments: very weak and debilitated, fear of falling.    Pertinent Vitals/Pain 109-140s during session with limited mobility.        Mobility  Bed Mobility Overal bed mobility: Needs Assistance Bed Mobility: Supine to Sit;Sit to Supine Supine to sit: Total assist;HOB elevated Sit to supine: Total assist General bed mobility comments: total assist to manage both legs and trunk to attempt to get to sitting EOB, however, during transitions pt holding the bed rail and pushing posteriorly because he felt like I was going to push him over the side of the bed.          PT Diagnosis: Difficulty walking;Abnormality of gait;Generalized weakness;Altered mental status  PT Problem List: Decreased strength;Decreased activity tolerance;Decreased balance;Decreased mobility;Decreased cognition;Decreased knowledge of use of DME;Decreased safety awareness;Decreased knowledge of precautions;Cardiopulmonary status limiting activity;Pain PT Treatment Interventions: DME instruction;Gait training;Functional mobility training;Therapeutic activities;Therapeutic exercise;Balance training;Neuromuscular re-education;Patient/family education;Cognitive remediation     PT Goals(Current goals can be found in the care plan section) Acute Rehab PT Goals Patient Stated Goal: unable to state PT Goal Formulation: With patient Time For Goal Achievement: 03/21/14 Potential to Achieve Goals: Good  Visit Information  Last PT Received On: 03/07/14 Assistance Needed: +2 History of Present Illness: 80 y.o. male admitted to [facility] on 03/03/14 with 80 yo male with hx HTN, DM, CAD, MI presented 3/12  after being found unresponsive.  Pt last seen ~5 days prior to admit by family at which time he was reportedly confused, not himself.  Neighbors called EMS d/t multiple  newspapers outside.  Unresponsive, hypotensive, hypothermic on EMS arrival, found in urine and fesces with multiple pressure ulcers.  ?DKA. BP 80's in ER despite 3L fluids, hypothermic with temp 91.   The pt was dx with septic shock, acute renal failure, DKA, CAD/NSTEMI with h/o CABG.  Cath 3/13 showed severe native 3 vessel CAD with total occlusion of the LM and RCA , patency of the free RIMA-LAD, LIMA-diagonal, and SVG-PDA, total occlusion of the SVG-LCx (appears chronic) and low LVEDP most consistent with septic/vasodiliatory shock.  He also has a contusion on the left side of his forhead.         Prior Functioning  Home Living Family/patient expects to be discharged to:: Private residence Living Arrangements: Alone Home Equipment: Dan HumphreysWalker - 2 wheels;Cane - single point Additional Comments: pt is a poor historian, hx will need to be confirmed by family.   Prior Function Level of Independence: Independent with assistive device(s) Comments: per pt he still drives, uses a RW sometimes.  Communication Communication: HOH    Cognition  Cognition Arousal/Alertness: Awake/alert Behavior During Therapy: WFL for tasks assessed/performed Overall Cognitive Status: Impaired/Different from baseline Area of Impairment: Orientation;Attention;Memory;Following commands;Safety/judgement;Awareness;Problem solving Orientation Level: Place;Time;Situation Current Attention Level: Focused Memory: Decreased short-term memory Following Commands: Follows one step commands inconsistently;Follows one step commands with increased time Safety/Judgement: Decreased awareness of deficits;Decreased awareness of safety Awareness: Intellectual Problem Solving: Slow processing;Decreased initiation;Requires verbal cues;Requires tactile cues General Comments: Pt is slow to process commands and questions.  Does not know he is in the hospital depite telling him earlier in the session.  Decreased attention to task and generally  unaware of how weak he is and that he is pushing backwards in sitting.     Extremity/Trunk Assessment Upper Extremity Assessment Upper Extremity Assessment: Generalized weakness Lower Extremity Assessment Lower Extremity Assessment: RLE deficits/detail;LLE deficits/detail RLE Deficits / Details: significant bil LE pitting edema.  Bil legs 2+ to 3-/5 strength per gross functional assessment and attempted EOB MMT.  Left is mildly stronger than right quad.   LLE Deficits / Details: significant bil LE pitting edema.  Bil legs 2+ to 3-/5 strength per gross functional assessment and attempted EOB MMT.  Left is mildly stronger than right quad.   Cervical / Trunk Assessment Cervical / Trunk Assessment: Kyphotic   Balance Balance Overall balance assessment: Needs assistance Sitting-balance support: Feet supported;Bilateral upper extremity supported Sitting balance-Leahy Scale: Zero Sitting balance - Comments: total assist to maintain sitting balance for 10 mins EOB.  RN came in and reported he was tachy during my session with rate into the 140s.  Sat EOB attempting to have pt follow one step commands to attempt to assess strength and see if trunk control improved with increased time EOB.   Postural control: Posterior lean (posterior push) General Comments General comments (skin integrity, edema, etc.): has rectal tube and foley  End of Session PT - End of Session Activity Tolerance: Patient limited by fatigue Patient left: in bed;with call bell/phone within reach Nurse Communication: Mobility status    Lurena JoinerRebecca B. Armenta Erskin, PT, DPT [phone]   03/07/2014, 5:58 PM

## 2014-03-07 NOTE — Progress Notes (Signed)
[facility] Progress Note [place] TEAM 1 - Stepdown/ICU TEAM   [name] [name] [id number] DOB: 03/26/1929 DOA: 03/03/2014 PCP: No primary provider on file.  Brief narrative: [name] GustRobert Dau is a 80 y.o. male presenting on 03/03/2014 with  has a past medical history of DM (diabetes mellitus); HTN (hypertension); CAD (coronary artery disease); MI (myocardial infarction); Dementia; Arthritis; Anxiety; and Hyperlipidemia who  presented 3/12 after being found unresponsive. Pt last seen ~5 days prior to admit by family at which time he was reportedly confused, not himself. Neighbors called EMS r/t multiple newspapers outside. Unresponsive, hypotensive, hypothermic on EMS arrival, found in urine and feces with multiple pressure ulcers. ?DKA. BP 80's in ER despite 3L fluids, hypothermic with temp 91.  3/12 Head / c-spine CT >>> nad  03/04/14: Not intubated. On 7mcg levophed. Drowsy, ? Cheyne stokes breathing but did follow commands and moved all 4s to RN. Metabolic acidosis on aBG. Trop > 20 ; on IV heparin. Cath - clean. LVEDP low   Subjective: Alert, has no complaints.   Assessment/Plan: Principal Problem:   Septic shock / Enterobacter bacteremia - - d/c Vanc and Zosyn and switch to Levaquin for total 10 days  Active Problems:   Acute renal failure--  Metabolic acidosis -due to sepsis and prerenal state - resolved and BUN/ Cr normalized    Acute respiratory failure - due to sepsis and decreased level of consciousness resolved    DKA  - resolved- Hba1c ordered    Encephalopathy acute - resolved    CAD/ NSTEMI with h/o CABG - cardiology following -Cath 3/13 showed severe native 3 vessel CAD with total occlusion of the LM and RCA , patency of the free RIMA-LAD, LIMA-diagonal, and SVG-PDA, total occlusion of the SVG-LCx (appears chronic) and low LVEDP most consistent with septic/vasodiliatory shock.  -EF preserved by echo.  -medical management of CAD recommended.  - inferior wall  hypokinesis on ECHO  Thrombocytopenia - due to sepsis? - follow  Hypokalemia - replace  C collar?  - c spine CT normal- will d/c collar   Code Status: full code Family Communication: none Disposition Plan: pt eval  Consultants: cardiology   Antibiotics: Antibiotics Given (last 72 hours)   Date/Time Action Medication Dose Rate   03/04/14 1453 Given   piperacillin-tazobactam (ZOSYN) IVPB 3.375 g 3.375 g 12.5 mL/hr   03/04/14 1718 Given   vancomycin (VANCOCIN) IVPB 750 mg/150 ml premix 750 mg 150 mL/hr   03/04/14 2147 Given   piperacillin-tazobactam (ZOSYN) IVPB 3.375 g 3.375 g 12.5 mL/hr   03/05/14 0528 Given   piperacillin-tazobactam (ZOSYN) IVPB 3.375 g 3.375 g 12.5 mL/hr   03/05/14 1439 Given   piperacillin-tazobactam (ZOSYN) IVPB 3.375 g 3.375 g 12.5 mL/hr   03/05/14 2137 Given   piperacillin-tazobactam (ZOSYN) IVPB 3.375 g 3.375 g 12.5 mL/hr   03/06/14 0502 Given   piperacillin-tazobactam (ZOSYN) IVPB 3.375 g 3.375 g 12.5 mL/hr   03/06/14 1510 Given   piperacillin-tazobactam (ZOSYN) IVPB 3.375 g 3.375 g 12.5 mL/hr   03/06/14 2305 Given  [Not compatible with pepcid]   piperacillin-tazobactam (ZOSYN) IVPB 3.375 g 3.375 g 12.5 mL/hr   03/07/14 0548 Given   piperacillin-tazobactam (ZOSYN) IVPB 3.375 g 3.375 g 12.5 mL/hr       DVT prophylaxis: Start Lovenox  Objective: Filed Weights   03/05/14 0437 03/06/14 0418 03/06/14 1602  Weight: 68.1 kg (150 lb 2.1 oz) 69.4 kg (153 lb) 74.934 kg (165 lb 3.2 oz)   Blood pressure 124/60, pulse 62, temperature 98.3  F (36.8 C), temperature source Oral, resp. rate 18, height 5\' 9"  (1.753 m), weight 74.934 kg (165 lb 3.2 oz), SpO2 98.00%.  Intake/Output Summary (Last 24 hours) at 03/07/14 1242 Last data filed at 03/07/14 1056  Gross per 24 hour  Intake 1825.84 ml  Output   2200 ml  Net -374.16 ml     Exam: General: No acute respiratory distress- neck in C collar Lungs: Clear to auscultation bilaterally without  wheezes or crackles Cardiovascular: Regular rate and rhythm without murmur gallop or rub normal S1 and S2 Abdomen: Nontender, nondistended, soft, bowel sounds positive, no rebound, no ascites, no appreciable mass Extremities: No significant cyanosis, clubbing, or edema bilateral lower extremities  Data Reviewed: Basic Metabolic Panel:  Recent Labs Lab 03/03/14 1446 03/03/14 1629  03/04/14 0500 03/04/14 1500 03/05/14 0445 03/06/14 0330 03/07/14 0519  NA 154* 154*  < > 159* 156* 157* 148* 143  K 5.3 5.3  < > 3.7 3.6* 3.0* 2.8* 3.2*  CL 101 112  < > 121* 120* 120* 111 104  CO2 9* 8*  < > 23 24 27 28 28   GLUCOSE 640* 500*  < > 165* 244* 145* 203* 185*  BUN 127* 104*  < > 87* 68* 48* 31* 18  CREATININE 2.01* 1.64*  < > 1.41* 1.14 1.06 0.95 0.72  CALCIUM 9.3 7.9*  < > 7.6* 7.4* 7.7* 7.5* 7.7*  MG  --  2.3  --   --   --  1.7 1.9 1.8  PHOS  --  4.0  --   --   --  0.8* 1.1* 1.5*  < > = values in this interval not displayed. Liver Function Tests:  Recent Labs Lab 03/03/14 1446 03/04/14 0500 03/05/14 0445  AST 16 51* 42*  ALT 18 20 22   ALKPHOS 73 50 65  BILITOT 0.7 0.4 0.4  PROT 5.0* 3.8* 4.1*  ALBUMIN 2.6* 1.8* 1.8*   No results found for this basename: LIPASE, AMYLASE,  in the last 168 hours No results found for this basename: AMMONIA,  in the last 168 hours CBC:  Recent Labs Lab 03/03/14 1446 03/04/14 0515 03/05/14 0445 03/06/14 0330 03/07/14 0519  WBC 6.4 5.4 10.9* 6.9 6.2  NEUTROABS 5.2  --  9.4* 6.1 5.6  HGB 15.2 12.0* 12.4* 11.7* 13.3  HCT 47.7 36.1* 37.7* 35.3* 40.2  MCV 95.0 90.0 89.3 89.1 89.3  PLT 141* 77* 78* 52* 44*   Cardiac Enzymes:  Recent Labs Lab 03/03/14 1446 03/03/14 1629 03/03/14 2221 03/04/14 0500 03/06/14 0330  CKTOTAL 249*  --   --  889* 214  CKMB  --   --   --  49.7* 5.8*  TROPONINI 0.42* 1.71* 11.22* >20.00*  --    BNP (last 3 results)  Recent Labs  03/05/14 0445  PROBNP 6895.0*   CBG:  Recent Labs Lab 03/06/14 2042  03/07/14 0009 03/07/14 0421 03/07/14 0818 03/07/14 1136  GLUCAP 147* 151* 164* 155* 185*    Recent Results (from the past 240 hour(s))  URINE CULTURE     Status: None   Collection Time    03/03/14  5:21 PM      Result Value Ref Range Status   Specimen Description URINE, CATHETERIZED   Final   Special Requests NONE   Final   Culture  Setup Time     Final   Value: 03/03/2014 18:57     Performed at [facility] Count     Final  Value: NO GROWTH     Performed at [facility]   Culture     Final   Value: NO GROWTH     Performed at [facility]   Report Status 03/04/2014 FINAL   Final  CULTURE, BLOOD (ROUTINE X 2)     Status: None   Collection Time    03/03/14  6:40 PM      Result Value Ref Range Status   Specimen Description BLOOD HAND RIGHT   Final   Special Requests BOTTLES DRAWN AEROBIC AND ANAEROBIC 5CCBLUE 4CCRED   Final   Culture  Setup Time     Final   Value: 03/03/2014 21:58     Performed at [facility]   Culture     Final   Value:        BLOOD CULTURE RECEIVED NO GROWTH TO DATE CULTURE WILL BE HELD FOR 5 DAYS BEFORE ISSUING A FINAL NEGATIVE REPORT     Performed at [facility]   Report Status PENDING   Incomplete  CULTURE, BLOOD (ROUTINE X 2)     Status: None   Collection Time    03/03/14  6:52 PM      Result Value Ref Range Status   Specimen Description BLOOD HAND LEFT   Final   Special Requests BOTTLES DRAWN AEROBIC AND ANAEROBIC 5CC   Final   Culture  Setup Time     Final   Value: 03/03/2014 22:01     Performed at [facility]   Culture     Final   Value: ENTEROBACTER AEROGENES     Note: Gram Stain Report Called to,Read Back By and Verified With: PORTER RN AT [phone] 03/05/14 BY SNOLO     Performed at [facility]   Report Status 03/07/2014 FINAL   Final   Organism ID, Bacteria ENTEROBACTER AEROGENES   Final  MRSA PCR SCREENING     Status: None   Collection Time    03/03/14 10:04 PM       Result Value Ref Range Status   MRSA by PCR NEGATIVE  NEGATIVE Final   Comment:            The GeneXpert MRSA Assay (FDA     approved for NASAL specimens     only), is one component of a     comprehensive MRSA colonization     surveillance program. It is not     intended to diagnose MRSA     infection nor to guide or     monitor treatment for     MRSA infections.     Studies:  Recent x-ray studies have been reviewed in detail by the Attending Physician  Scheduled Meds:  Scheduled Meds: . antiseptic oral rinse  15 mL Mouth Rinse q12n4p  . aspirin  300 mg Rectal Daily  . chlorhexidine  15 mL Mouth Rinse BID  . famotidine (PEPCID) IV  20 mg Intravenous Q12H  . insulin aspart  0-15 Units Subcutaneous 6 times per day  . piperacillin-tazobactam (ZOSYN)  IV  3.375 g Intravenous 3 times per day  . potassium phosphate IVPB (mmol)  30 mmol Intravenous BID   Continuous Infusions: . dextrose 50 mL (03/07/14 0943)  . norepinephrine (LEVOPHED) Adult infusion Stopped (03/05/14 1113)    Time spent on care of this patient: . 35 min  Calvert Cantor, MD  [facility] Office  [phone] Pager - Text Page per Loretha Stapler as per below:  On-Call/Text Page:  [email or web address]  If 7PM-7AM, please contact night-coverage [email or web address] 03/07/2014, 12:42 PM   LOS: 4 days

## 2014-03-08 ENCOUNTER — Inpatient Hospital Stay (HOSPITAL_COMMUNITY): Payer: Medicare HMO

## 2014-03-08 DIAGNOSIS — I2510 Atherosclerotic heart disease of native coronary artery without angina pectoris: Secondary | ICD-10-CM

## 2014-03-08 DIAGNOSIS — E43 Unspecified severe protein-calorie malnutrition: Secondary | ICD-10-CM | POA: Diagnosis present

## 2014-03-08 LAB — PHOSPHORUS: Phosphorus: 3.8 mg/dL (ref 2.3–4.6)

## 2014-03-08 LAB — CBC WITH DIFFERENTIAL/PLATELET
BASOS ABS: 0 10*3/uL (ref 0.0–0.1)
Basophils Relative: 0 % (ref 0–1)
Eosinophils Absolute: 0 10*3/uL (ref 0.0–0.7)
Eosinophils Relative: 0 % (ref 0–5)
HEMATOCRIT: 38.9 % — AB (ref 39.0–52.0)
Hemoglobin: 12.8 g/dL — ABNORMAL LOW (ref 13.0–17.0)
LYMPHS PCT: 3 % — AB (ref 12–46)
Lymphs Abs: 0.4 10*3/uL — ABNORMAL LOW (ref 0.7–4.0)
MCH: 29.5 pg (ref 26.0–34.0)
MCHC: 32.9 g/dL (ref 30.0–36.0)
MCV: 89.6 fL (ref 78.0–100.0)
MONO ABS: 0.9 10*3/uL (ref 0.1–1.0)
Monocytes Relative: 6 % (ref 3–12)
Neutro Abs: 13.2 10*3/uL — ABNORMAL HIGH (ref 1.7–7.7)
Neutrophils Relative %: 91 % — ABNORMAL HIGH (ref 43–77)
PLATELETS: 48 10*3/uL — AB (ref 150–400)
RBC: 4.34 MIL/uL (ref 4.22–5.81)
RDW: 14 % (ref 11.5–15.5)
WBC: 14.5 10*3/uL — AB (ref 4.0–10.5)

## 2014-03-08 LAB — CARBOXYHEMOGLOBIN
CARBOXYHEMOGLOBIN: 1.5 % (ref 0.5–1.5)
METHEMOGLOBIN: 0.7 % (ref 0.0–1.5)
O2 Saturation: 57.8 %
TOTAL HEMOGLOBIN: 11.4 g/dL — AB (ref 13.5–18.0)

## 2014-03-08 LAB — MAGNESIUM: MAGNESIUM: 1.6 mg/dL (ref 1.5–2.5)

## 2014-03-08 LAB — BASIC METABOLIC PANEL
BUN: 10 mg/dL (ref 6–23)
BUN: 10 mg/dL (ref 6–23)
CALCIUM: 6.5 mg/dL — AB (ref 8.4–10.5)
CHLORIDE: 104 meq/L (ref 96–112)
CO2: 29 mEq/L (ref 19–32)
CO2: 28 mEq/L (ref 19–32)
Calcium: 7.6 mg/dL — ABNORMAL LOW (ref 8.4–10.5)
Chloride: 98 mEq/L (ref 96–112)
Creatinine, Ser: 0.72 mg/dL (ref 0.50–1.35)
Creatinine, Ser: 0.68 mg/dL (ref 0.50–1.35)
GFR calc Af Amer: >90 mL/min (ref >90)
GFR calc non Af Amer: 85 mL/min — ABNORMAL LOW (ref >90)
GFR calc non Af Amer: 87 mL/min — ABNORMAL LOW (ref >90)
GLUCOSE: 243 mg/dL — AB (ref 70–99)
Glucose, Bld: 201 mg/dL — ABNORMAL HIGH (ref 70–99)
Potassium: 3.7 mEq/L (ref 3.7–5.3)
Potassium: 2.6 mEq/L — CL (ref 3.7–5.3)
SODIUM: 142 meq/L (ref 137–147)
Sodium: 139 mEq/L (ref 137–147)

## 2014-03-08 LAB — GLUCOSE, CAPILLARY
GLUCOSE-CAPILLARY: 208 mg/dL — AB (ref 70–99)
Glucose-Capillary: 227 mg/dL — ABNORMAL HIGH (ref 70–99)
Glucose-Capillary: 214 mg/dL — ABNORMAL HIGH (ref 70–99)
Glucose-Capillary: 210 mg/dL — ABNORMAL HIGH (ref 70–99)
Glucose-Capillary: 144 mg/dL — ABNORMAL HIGH (ref 70–99)

## 2014-03-08 LAB — LACTIC ACID, PLASMA: LACTIC ACID, VENOUS: 1.3 mmol/L (ref 0.5–2.2)

## 2014-03-08 LAB — HEMOGLOBIN A1C
HEMOGLOBIN A1C: 15 % — AB (ref <5.7)
MEAN PLASMA GLUCOSE: 384 mg/dL — AB (ref <117)

## 2014-03-08 LAB — TROPONIN I: TROPONIN I: 1.04 ng/mL — AB (ref <0.30)

## 2014-03-08 LAB — PRO B NATRIURETIC PEPTIDE: PRO B NATRI PEPTIDE: 7032 pg/mL — AB (ref 0–450)

## 2014-03-08 LAB — CLOSTRIDIUM DIFFICILE BY PCR: CDIFFPCR: POSITIVE — AB

## 2014-03-08 MED ORDER — METOPROLOL TARTRATE 12.5 MG HALF TABLET
12.5000 mg | ORAL_TABLET | Freq: Two times a day (BID) | ORAL | Status: DC
  Filled 2014-03-08 (×2): qty 1

## 2014-03-08 MED ORDER — SODIUM CHLORIDE 0.9 % IV BOLUS (SEPSIS): 1000.0000 mL | INTRAVENOUS | Status: DC | PRN

## 2014-03-08 MED ORDER — POTASSIUM CHLORIDE 20 MEQ/15ML (10%) PO LIQD
40.0000 meq | Freq: Once | ORAL | Status: AC
  Administered 2014-03-09: 40 meq
  Filled 2014-03-08: qty 30

## 2014-03-08 MED ORDER — SODIUM CHLORIDE 0.9 % IV BOLUS (SEPSIS)
1000.0000 mL | INTRAVENOUS | Status: DC | PRN
  Administered 2014-03-08 (×3): 1000 mL via INTRAVENOUS

## 2014-03-08 MED ORDER — DEXTROSE 5 % IV SOLN
5.0000 ug/min | INTRAVENOUS | Status: DC
  Filled 2014-03-08: qty 4

## 2014-03-08 MED ORDER — POTASSIUM CHLORIDE 10 MEQ/50ML IV SOLN
10.0000 meq | INTRAVENOUS | Status: AC
  Administered 2014-03-08 – 2014-03-09 (×4): 10 meq via INTRAVENOUS
  Filled 2014-03-08 (×4): qty 50

## 2014-03-08 MED ORDER — SODIUM CHLORIDE 0.9 % IV SOLN
INTRAVENOUS | Status: DC
  Administered 2014-03-08 – 2014-03-12 (×7): via INTRAVENOUS

## 2014-03-08 MED ORDER — MAGNESIUM OXIDE 400 (241.3 MG) MG PO TABS
400.0000 mg | ORAL_TABLET | Freq: Two times a day (BID) | ORAL | Status: DC
  Administered 2014-03-08: 400 mg via ORAL
  Filled 2014-03-08 (×3): qty 1

## 2014-03-08 MED ORDER — METRONIDAZOLE IN NACL 5-0.79 MG/ML-% IV SOLN
500.0000 mg | Freq: Three times a day (TID) | INTRAVENOUS | Status: DC
  Administered 2014-03-08 – 2014-03-14 (×18): 500 mg via INTRAVENOUS
  Filled 2014-03-08 (×20): qty 100

## 2014-03-08 MED ORDER — INSULIN DETEMIR 100 UNIT/ML ~~LOC~~ SOLN
18.0000 [IU] | Freq: Every day | SUBCUTANEOUS | Status: DC
  Filled 2014-03-08: qty 0.18

## 2014-03-08 MED ORDER — VANCOMYCIN 50 MG/ML ORAL SOLUTION
500.0000 mg | Freq: Four times a day (QID) | ORAL | Status: DC
  Administered 2014-03-09 – 2014-03-15 (×28): 500 mg
  Filled 2014-03-08 (×33): qty 10

## 2014-03-08 MED ORDER — INSULIN ASPART 100 UNIT/ML ~~LOC~~ SOLN
0.0000 [IU] | SUBCUTANEOUS | Status: DC
  Administered 2014-03-09: 2 [IU] via SUBCUTANEOUS
  Administered 2014-03-10: 3 [IU] via SUBCUTANEOUS
  Administered 2014-03-10: 8 [IU] via SUBCUTANEOUS
  Administered 2014-03-10: 5 [IU] via SUBCUTANEOUS
  Administered 2014-03-11: 2 [IU] via SUBCUTANEOUS
  Administered 2014-03-11: 8 [IU] via SUBCUTANEOUS
  Administered 2014-03-11: 3 [IU] via SUBCUTANEOUS
  Administered 2014-03-11 (×2): 2 [IU] via SUBCUTANEOUS
  Administered 2014-03-12: 3 [IU] via SUBCUTANEOUS
  Administered 2014-03-12: 2 [IU] via SUBCUTANEOUS
  Administered 2014-03-12 – 2014-03-13 (×3): 3 [IU] via SUBCUTANEOUS
  Administered 2014-03-13: 2 [IU] via SUBCUTANEOUS
  Administered 2014-03-13 (×3): 3 [IU] via SUBCUTANEOUS
  Administered 2014-03-14: 2 [IU] via SUBCUTANEOUS
  Administered 2014-03-14: 5 [IU] via SUBCUTANEOUS
  Administered 2014-03-14: 2 [IU] via SUBCUTANEOUS
  Administered 2014-03-14: 5 [IU] via SUBCUTANEOUS
  Administered 2014-03-14: 2 [IU] via SUBCUTANEOUS
  Administered 2014-03-14: 3 [IU] via SUBCUTANEOUS
  Administered 2014-03-15 (×3): 2 [IU] via SUBCUTANEOUS
  Administered 2014-03-15: 3 [IU] via SUBCUTANEOUS
  Administered 2014-03-15: 2 [IU] via SUBCUTANEOUS

## 2014-03-08 MED ORDER — GLUCERNA SHAKE PO LIQD
237.0000 mL | Freq: Two times a day (BID) | ORAL | Status: DC
  Administered 2014-03-08 (×2): 237 mL via ORAL

## 2014-03-08 NOTE — Progress Notes (Signed)
Patient found with SBP in 90's by Nurse Tech. Came in to assess and found bp in 60's  And patient pale. Notified Dr. Butler Denmarkizwan and and  Rapid  Response. 1 Liter bolus started and will continue to monitor.

## 2014-03-08 NOTE — Progress Notes (Signed)
Patient Name: [name] GustRobert Prestigiacomo Date of Encounter: 03/08/2014     Principal Problem:   Sepsis Active Problems:   Acute renal failure   Acute respiratory failure   DKA (diabetic ketoacidoses)   Metabolic acidosis   Encephalopathy acute   Shock circulatory   CAD (coronary artery disease), native coronary artery   NSTEMI (non-ST elevated myocardial infarction)   Protein-calorie malnutrition, severe    SUBJECTIVE  The patient looks slightly better today.  He is more awake.  He recognized my name and told me that I was his brother's doctor which is correct.  He denies chest pain or shortness of breath.  CURRENT MEDS . antiseptic oral rinse  15 mL Mouth Rinse q12n4p  . chlorhexidine  15 mL Mouth Rinse BID  . enoxaparin (LOVENOX) injection  40 mg Subcutaneous Q24H  . famotidine (PEPCID) IV  20 mg Intravenous Q12H  . feeding supplement (GLUCERNA SHAKE)  237 mL Oral BID BM  . insulin aspart  0-15 Units Subcutaneous TID WC  . insulin detemir  18 Units Subcutaneous QHS  . levofloxacin (LEVAQUIN) IV  750 mg Intravenous Q24H  . magnesium oxide  400 mg Oral BID    OBJECTIVE  Filed Vitals:   03/07/14 1746 03/07/14 2136 03/08/14 0502 03/08/14 0507  BP:  131/88  121/87  Pulse: 140 105  105  Temp:  98.4 F (36.9 C)  98.3 F (36.8 C)  TempSrc:  Axillary  Oral  Resp:  19  18  Height:      Weight:   154 lb 12.2 oz (70.2 kg)   SpO2:  95%  92%    Intake/Output Summary (Last 24 hours) at 03/08/14 1343 Last data filed at 03/08/14 0918  Gross per 24 hour  Intake 824.16 ml  Output    550 ml  Net 274.16 ml   Filed Weights   03/06/14 0418 03/06/14 1602 03/08/14 0502  Weight: 153 lb (69.4 kg) 165 lb 3.2 oz (74.934 kg) 154 lb 12.2 oz (70.2 kg)    PHYSICAL EXAM  General: Pleasant, NAD. Neuro: Alert and oriented X 3. Moves all extremities spontaneously. Psych: Normal affect. HEENT:  Normal  Neck: Supple without bruits or JVD. Lungs:  Resp regular and unlabored, CTA. Heart: RRR no  s3, s4, or murmurs. Abdomen: Soft, non-tender, non-distended, BS + x 4.  Extremities: Mild pretibial and pedal edema.  Accessory Clinical Findings  CBC  Recent Labs  03/07/14 0519 03/08/14 0405  WBC 6.2 14.5*  NEUTROABS 5.6 13.2*  HGB 13.3 12.8*  HCT 40.2 38.9*  MCV 89.3 89.6  PLT 44* 48*   Basic Metabolic Panel  Recent Labs  03/07/14 0519 03/08/14 0405  NA 143 139  K 3.2* 3.7  CL 104 98  CO2 28 29  GLUCOSE 185* 243*  BUN 18 10  CREATININE 0.72 0.72  CALCIUM 7.7* 7.6*  MG 1.8 1.6  PHOS 1.5* 3.8   Liver Function Tests No results found for this basename: AST, ALT, ALKPHOS, BILITOT, PROT, ALBUMIN,  in the last 72 hours No results found for this basename: LIPASE, AMYLASE,  in the last 72 hours Cardiac Enzymes  Recent Labs  03/06/14 0330  CKTOTAL 214  CKMB 5.8*   BNP No components found with this basename: POCBNP,  D-Dimer No results found for this basename: DDIMER,  in the last 72 hours Hemoglobin A1C No results found for this basename: HGBA1C,  in the last 72 hours Fasting Lipid Panel No results found for this basename: CHOL, HDL,  LDLCALC, TRIG, CHOLHDL, LDLDIRECT,  in the last 72 hours Thyroid Function Tests No results found for this basename: TSH, T4TOTAL, FREET3, T3FREE, THYROIDAB,  in the last 72 hours  TELE  Normal sinus rhythm and sinus tachycardia  ECG    Radiology/Studies  Ct Head Wo Contrast  03/03/2014   CLINICAL DATA:  Confused, unresponsive  EXAM: CT HEAD WITHOUT CONTRAST  CT CERVICAL SPINE WITHOUT CONTRAST  TECHNIQUE: Multidetector CT imaging of the head and cervical spine was performed following the standard protocol without intravenous contrast. Multiplanar CT image reconstructions of the cervical spine were also generated.  COMPARISON:  None.  FINDINGS: CT HEAD FINDINGS  There is no evidence of mass effect, midline shift, or extra-axial fluid collections. There is no evidence of a space-occupying lesion or intracranial hemorrhage.  There is no evidence of a cortical-based area of acute infarction. There is generalized cerebral atrophy. There is periventricular white matter low attenuation likely secondary to microangiopathy.  The ventricles and sulci are appropriate for the patient's age. The basal cisterns are patent.  Visualized portions of the orbits are unremarkable. The visualized portions of the paranasal sinuses and mastoid air cells are unremarkable. Cerebrovascular atherosclerotic calcifications are noted.  The osseous structures are unremarkable.  CT CERVICAL SPINE FINDINGS  The alignment is anatomic. The vertebral body heights are maintained. There is no acute fracture. There is no static listhesis. The prevertebral soft tissues are normal. The intraspinal soft tissues are not fully imaged on this examination due to poor soft tissue contrast, but there is no gross soft tissue abnormality.  There is degenerative disc disease throughout the cervical spine. There broad-based disc bulges at C2-3, C3-4, C4-5, C5-6 and C6-7. There is bilateral facet arthropathy extending from C3-4 through C7-T1. There is bilateral foraminal stenosis at C5-6 and C6-7. Marland Kitchen There is unilateral right foraminal stenosis at C3-4 and C4-5.  There is left carotid artery atherosclerosis. There is evidence of prior right carotid endarterectomy.  IMPRESSION: 1. No acute intracranial pathology. 2. No acute osseous injury of the cervical spine. 3. Diffuse cervical spine spondylosis as described above.   Electronically Signed   By: Elige Ko   On: 03/03/2014 16:00   Ct Cervical Spine Wo Contrast  03/03/2014   CLINICAL DATA:  Confused, unresponsive  EXAM: CT HEAD WITHOUT CONTRAST  CT CERVICAL SPINE WITHOUT CONTRAST  TECHNIQUE: Multidetector CT imaging of the head and cervical spine was performed following the standard protocol without intravenous contrast. Multiplanar CT image reconstructions of the cervical spine were also generated.  COMPARISON:  None.  FINDINGS:  CT HEAD FINDINGS  There is no evidence of mass effect, midline shift, or extra-axial fluid collections. There is no evidence of a space-occupying lesion or intracranial hemorrhage. There is no evidence of a cortical-based area of acute infarction. There is generalized cerebral atrophy. There is periventricular white matter low attenuation likely secondary to microangiopathy.  The ventricles and sulci are appropriate for the patient's age. The basal cisterns are patent.  Visualized portions of the orbits are unremarkable. The visualized portions of the paranasal sinuses and mastoid air cells are unremarkable. Cerebrovascular atherosclerotic calcifications are noted.  The osseous structures are unremarkable.  CT CERVICAL SPINE FINDINGS  The alignment is anatomic. The vertebral body heights are maintained. There is no acute fracture. There is no static listhesis. The prevertebral soft tissues are normal. The intraspinal soft tissues are not fully imaged on this examination due to poor soft tissue contrast, but there is no gross soft tissue abnormality.  There is degenerative disc disease throughout the cervical spine. There broad-based disc bulges at C2-3, C3-4, C4-5, C5-6 and C6-7. There is bilateral facet arthropathy extending from C3-4 through C7-T1. There is bilateral foraminal stenosis at C5-6 and C6-7. Marland Kitchen There is unilateral right foraminal stenosis at C3-4 and C4-5.  There is left carotid artery atherosclerosis. There is evidence of prior right carotid endarterectomy.  IMPRESSION: 1. No acute intracranial pathology. 2. No acute osseous injury of the cervical spine. 3. Diffuse cervical spine spondylosis as described above.   Electronically Signed   By: Elige Ko   On: 03/03/2014 16:00   Dg Chest Port 1 View  03/04/2014   CLINICAL DATA:  Hypoxia.  EXAM: PORTABLE CHEST - 1 VIEW  COMPARISON:  03/03/2014  FINDINGS: The left subclavian central line still extends along the left side of the heart and mediastinum.  Similar to the previous chest radiograph reports, this could be within a persistent left SVC but cannot exclude intra-arterial placement. No evidence for a pneumothorax. Stable appearance of the heart and mediastinum with median sternotomy wires. Lungs are clear without airspace disease or pulmonary edema.  IMPRESSION: Abnormal position of the left subclavian central line as described.  No focal chest disease.   Electronically Signed   By: Richarda Overlie M.D.   On: 03/04/2014 08:16   Dg Chest Portable 1 View  03/03/2014   CLINICAL DATA:  Altered mental status.  EXAM: PORTABLE CHEST - 1 VIEW  COMPARISON:  March 03, 2014.  FINDINGS: Stable cardiomediastinal silhouette. Status post coronary artery bypass graft. No pneumothorax or pleural effusion is noted. No acute pulmonary disease is noted. Left internal jugular catheter has been removed and replaced with left subclavian catheter; this catheter also projects over the left side of the mediastinum. This either represents intra-arterial position or congenital venous variant such as left-sided superior vena cava. Bony thorax is intact.  IMPRESSION: No acute cardiopulmonary abnormality seen. Left internal jugular catheter has been removed and replaced with left subclavian catheter, which also demonstrates left-sided position over mediastinum. As described above, this represents either intra-arterial position or congenital venous variant such as left-sided superior vena cava.   Electronically Signed   By: Roque Lias M.D.   On: 03/03/2014 18:39   Dg Chest Port 1 View  03/03/2014   CLINICAL DATA:  Status post line placement.  EXAM: PORTABLE CHEST - 1 VIEW  COMPARISON:  None.  FINDINGS: There is a left internal jugular central venous catheter with the tip projecting over the left aspect of the mediastinum. Patient is rotated, limiting evaluation. Grossly unremarkable mediastinal contours given the patient rotation. Patient status post median sternotomy and CABG procedure.  The lungs are well aerated. No large consolidative pulmonary opacities. No pleural effusion or pneumothorax. The left costophrenic angle is excluded from view. Regional skeleton is unremarkable.  IMPRESSION:AAAA: IMPRESSION:AAAA Left internal jugular central venous catheter tip projects over the left aspect of the mediastinum. This is concerning for intra-arterial line placement in the absence of known variant anatomy such as left-sided superior vena cava. Recommend clinical correlation, potentially with arterial blood gas. Additionally followup chest radiograph with repositioning is recommended.  Critical Value/emergent results were called by telephone at the time of interpretation on 03/03/2014 at 5:34 PM to Dr. Marin Shutter, who verbally acknowledged these results.   Electronically Signed   By: Annia Belt M.D.   On: 03/03/2014 17:44   Dg Abd Portable 1v  03/05/2014   CLINICAL DATA:  Nasogastric tube placement  EXAM: PORTABLE  ABDOMEN - 1 VIEW  COMPARISON:  None.  FINDINGS: Nasogastric tube tip and side port are in the stomach. Visualized bowel gas pattern is unremarkable. No obstruction or free air is seen on this supine examination. There is calcification to the left of L2 of uncertain etiology. There is arthropathy in the lumbar spine.  IMPRESSION: Nasogastric tube tip and side port in stomach. Bowel gas pattern nonspecific. Calcification to the left of L2 of uncertain etiology.   Electronically Signed   By: Bretta Bang M.D.   On: 03/05/2014 19:32    ASSESSMENT AND PLAN  1. Septic/vasodilatory shock with GNR bacteremia (enterobacter aerogenes) and DKA - off pressors. Improving. ?Source. On abx.  2. NSTEMI secondary to type II demand ischemia from sepsis with troponin > 20. H/o remote MI and CABG. Cath Cath 3/13 showed severe native 3 vessel CAD with total occlusion of the LM and RCA , patency of the free RIMA-LAD, LIMA-diagonal, and SVG-PDA, total occlusion of the SVG-LCx (appears chronic) and  low LVEDP most consistent with septic/vasodiliatory shock. EF preserved by echo. Medical management of CAD recommended. Continue ASA with cautious eye on platelets.  Recommend to add statin when taking POs -await IM input on when he's ready for diet.  3. Acute encephalopathy with underlying dementia - improving but not fully oriented. Was living independently PTA. Will likely need some sort of rehab following discharge.  4. Thrombocytopenia - PCCM felt due to sepsis rather than HIT. Will need to follow while on aspirin.  5. Dyslipidemia - consider statin when taking orals.  6. Hypokalemia/hypernatremia - primary team repleting potassium. Hypokalemia may be contributing to PVCs. HyperNa+ resolved.  7. Lower extremity edema - stable  Plan: He is now taking oral fluids and food.  I will add low-dose beta blocker.  Signed, Cassell Clement MD

## 2014-03-08 NOTE — Clinical Social Work Placement (Addendum)
Clinical Social Work Department CLINICAL SOCIAL WORK PLACEMENT NOTE 03/08/2014  Patient:  [name] [name],[name] [name]  Account Number:  [id number] Admit date:  03/03/2014  Clinical Social Worker:  Cherre BlancJOSEPH BRYANT CAMPBELL, [place]  Date/time:  03/08/2014 11:00 AM  Clinical Social Work is seeking post-discharge placement for this patient at the following level of care:   SKILLED NURSING   (*CSW will update this form in Epic as items are completed)   03/08/2014  [facility]family provided with [facility] of Clinical Social Work's list of facilities offering this level of care within the geographic area requested by the patient (or if unable, by the patient's family).  03/08/2014  Patient/family informed of their freedom to choose among providers that offer the needed level of care, that participate in Medicare, Medicaid or managed care program needed by the patient, have an available bed and are willing to accept the patient.  03/08/2014  Patient/family informed of [facility]' ownership interest in [facility], as well as of the fact that they are under no obligation to receive care at this facility.  PASARR submitted to EDS on 03/08/2014 PASARR number received from EDS on 03/08/2014  FL2 transmitted to all facilities in geographic area requested by pt/family on  03/08/2014 FL2 transmitted to all facilities within larger geographic area on   Patient informed that his/her managed care company has contracts with or will negotiate with  certain facilities, including the following:     Patient/family informed of bed offers received:  03/08/2014 [facility] Physician recommends and patient chooses bed at    Patient to be transferred to  on 03/15/2014 [facility]  The following physician request were entered in Epic:   Additional Comments: Brother [name] chose [facility] but family is unwilling to complete paperwork today. If  family will not complete paperwork prior to patient's admission, patient will not be able to go to [facility]. Family informed.   Roddie McBryant Campbell, [place], [place], [phone]

## 2014-03-08 NOTE — Progress Notes (Signed)
eLink Physician-Brief Progress Note Patient Name: Beverly GustRobert Cantrelle DOB: 07/02/1929 MRN: [id number]  Date of Service  03/08/2014   HPI/Events of Note     eICU Interventions  Hypokalemia -repleted    Intervention Category Intermediate Interventions: Electrolyte abnormality - evaluation and management  ALVA,RAKESH V. 03/08/2014, 8:47 PM

## 2014-03-08 NOTE — Evaluation (Signed)
Speech Language Pathology Evaluation Patient Details Name: [name] [name] MRN: [id number] DOB: 14-Jul-1929 Today's Date: 03/08/2014 Time: 4098-1191 SLP Time Calculation (min): 15 min  Problem List:  Patient Active Problem List   Diagnosis Date Noted  . Protein-calorie malnutrition, severe 03/08/2014  . Sepsis 03/07/2014  . Metabolic acidosis 03/04/2014  . Encephalopathy acute 03/04/2014  . Shock circulatory 03/04/2014  . CAD (coronary artery disease), native coronary artery 03/04/2014  . NSTEMI (non-ST elevated myocardial infarction) 03/04/2014  . Acute renal failure 03/03/2014  . Acute respiratory failure 03/03/2014  . DKA (diabetic ketoacidoses) 03/03/2014   Past Medical History:  Past Medical History  Diagnosis Date  . DM (diabetes mellitus)   . HTN (hypertension)   . CAD (coronary artery disease)   . MI (myocardial infarction)   . Dementia   . Arthritis   . Anxiety   . Hyperlipidemia    Past Surgical History:  Past Surgical History  Procedure Laterality Date  . Coronary artery bypass graft    . Carotid endarterectomy     HPI:  80 y.o. male admitted to [facility] with hx of HTN, DM, CAD, MI.  Presented 3/12 after being found unresponsive. Pt last seen ~5 days prior to admit by family at which time he was reportedly confused, not himself. Neighbors called EMS after seeing multiple newspapers outside. Unresponsive, hypotensive, hypothermic on EMS arrival, found in urine and fesces with multiple pressure ulcers and contusion on left side of his head. ?DKA. BP 80's in ER despite 3L fluids, hypothermic with temp 91. The pt was dx with septic shock, acute renal failure, DKA, CAD/NSTEMI with h/o CABG. Cath 3/13 showed CAD.    Assessment / Plan / Recommendation Clinical Impression  Patient presents moderately-severe cognitive deficits characterized by decreased arousal and impaired sustained attention which impact all higher level abilities and his overall safety with basic self-care  tasks.  As a result this patient would benefit from skilled SLP services to address these deficits, maximize functional independence and reduce burden of care prior to discharge with 24/7 supervision.     SLP Assessment  Patient needs continued Speech Lanaguage Pathology Services    Follow Up Recommendations  24 hour supervision/assistance;Home health SLP;Skilled Nursing facility    Frequency and Duration min 2x/week  1 week   Pertinent Vitals/Pain none   SLP Goals  SLP Goals Potential to Achieve Goals: Good Potential Considerations: Ability to learn/carryover information;Family/community support;Severity of impairments  SLP Evaluation Prior Functioning  Cognitive/Linguistic Baseline: Information not available   Cognition  Overall Cognitive Status: No family/caregiver present to determine baseline cognitive functioning (impaired) Arousal/Alertness: Lethargic Orientation Level: Oriented to person;Disoriented to place;Disoriented to time;Disoriented to situation Attention: Focused;Sustained Focused Attention: Appears intact Sustained Attention: Impaired Sustained Attention Impairment: Verbal basic;Functional basic Memory: Impaired Memory Impairment: Decreased recall of new information;Decreased short term memory Decreased Short Term Memory: Verbal basic Awareness: Impaired Awareness Impairment: Intellectual impairment Problem Solving: Impaired Problem Solving Impairment: Verbal basic;Functional basic Safety/Judgment: Impaired Comments: impaired arousal and sustianed attention impact all other higher level cognitive abilities     Comprehension  Auditory Comprehension Overall Auditory Comprehension: Impaired (suspect attention impacts; reassess as cognition improves) Yes/No Questions: Impaired Basic Immediate Environment Questions: 50-74% accurate Commands: Impaired One Step Basic Commands: 50-74% accurate Interfering Components: Attention;Working Electrical engineer: Not tested Reading Comprehension Reading Status: Not tested    Expression Expression Primary Mode of Expression: Verbal Verbal Expression Overall Verbal Expression: Appears within functional limits for tasks assessed Written Expression Written Expression: Not tested  Oral / Motor Oral Motor/Sensory Function Overall Oral Motor/Sensory Function: Appears within functional limits for tasks assessed Motor Speech Overall Motor Speech: Impaired Phonation: Low vocal intensity Intelligibility: Intelligibility reduced Effective Techniques: Increased vocal intensity   GO    Charlane FerrettiMelissa Sherria Riemann, M.A., CCC-SLP [phone]  Cleon Signorelli 03/08/2014, 5:41 PM

## 2014-03-08 NOTE — Significant Event (Signed)
Rapid Response Event Note  Overview: Time Called: 1550 Arrival Time: 1553 Event Type: Hypotension  Initial Focused Assessment: Patient with manual BP 60/42 confirmed with doppler.  Patient alert and oriented, but feels fatigued and tired. Patient very pale, ghostly white Lung sounds decreased bases, irregular heart tones No complaints of pain  Interventions: 1L NS bolus infused, BP improved to 70/40, MD notified 2nd Liter NS bolus infused BP 84/58, MD notified Patient color improving, but has been very sleepy this afternoon. 3rd Liter NS initiated, orders received for  ICU tx Patient more alert and complaining of Right shoulder pain, placed heat pack prior to transfer RN updated on patient status.   Event Summary: Name of Physician Notified: Dr Butler Denmarkizwan at [phone]  Name of Consulting Physician Notified: CCM at 1745  Outcome: Transferred (Comment) (2M10)     [name] [name], [name] [name]

## 2014-03-08 NOTE — Progress Notes (Signed)
eLink Physician-Brief Progress Note Patient Name: [name] GustRobert [name] DOB: 11/07/1929 MRN: [id number]  Date of Service  03/08/2014   HPI/Events of Note   C diff with hypotension  eICU Interventions  Chk lactate Fluids per CVP Add levophed Transfer to ICU planned per [facility]   Intervention Category Major Interventions: Sepsis - evaluation and management  ALVA,RAKESH V. 03/08/2014, 5:44 PM

## 2014-03-08 NOTE — Progress Notes (Signed)
Name: [name] [name] MRN: [id number] DOB: [birth year]    ADMISSION DATE:  03/03/2014  REFERRING MD :  EDP PRIMARY SERVICE: PCCM  CHIEF COMPLAINT:  Found down  BRIEF PATIENT DESCRIPTION:    80 yo male with hx HTN, DM, CAD, MI presented 3/12 after being found unresponsive.  Pt last seen ~5 days prior to admit by family at which time he was reportedly confused, not himself.  Neighbors called EMS r/t multiple newspapers outside.  Unresponsive, hypotensive, hypothermic on EMS arrival, found in urine and fesces with multiple pressure ulcers.  ?DKA. BP 80's in ER despite 3L fluids, hypothermic with temp 91.   LINES / TUBES: L IJ CVL 3/12 >>> Foley 3/12 >>>  CULTURES: 3/12  Blood >>> enterobacter  3/12  Urine >>> neg  3/12 MRSA PCR - negative 3/17: c diff: positive   ANTIBIOTICS: Vancomycin 3/12>>>3/15 Zosyn 3/12 >>>3/17 levaquin 3/17>>> Flagyl 3/17>>> Oral vanc 3/17>>>  SIGNIFICANT EVENTS / STUDIES:  3/12  Head / c-spine CT >>> nad 03/04/14: Not intubated. On levophed. Drowsy, ? Cheyne stokes breathing but did follow commands and moved all 4s to RN.  Metabolic acidosis on aBG. Trop > 20 ; on IV heparin.-Cath Cath 3/13 showed severe native 3 vessel CAD with total occlusion of the LM and RCA , patency of the free RIMA-LAD, LIMA-diagonal, and SVG-PDA, total occlusion of the SVG-LCx (appears chronic) and low LVEDP most consistent with septic/vasodiliatory shock 03/05/14 Remains unintubated. Off pressors x 4h. Drowsy but more responsive than yesterday: says he is "I am better" 3/15: moved to med ward and transferred to IM service.  3/16 c collar d/c'd 3/17: diarrhea rising WBC C diff +. BP dropping in PM hours down into 60s. 3 liters NS w/ BP slow to respond so moved back to ICU and PCCM re-assumed care.   SUBJECTIVE/OVERNIGHT/INTERVAL HX Lethargic. Responding to IVFs  VITAL SIGNS: Temp:  [98 F (36.7 C)-100.3 F (37.9 C)] 98.6 F (37 C) (03/17 1939) Pulse Rate:  [41-116] 41  (03/17 2200) Resp:  [15-24] 21 (03/17 2200) BP: (60-129)/(32-87) 92/45 mmHg (03/17 2200) SpO2:  [91 %-100 %] 100 % (03/17 2200) Weight:  [70.2 kg (154 lb 12.2 oz)] 70.2 kg (154 lb 12.2 oz) (03/17 0502)  HEMODYNAMICS: CVP:  [4 mmHg-7 mmHg] 7 mmHg VENTILATOR SETTINGS:   INTAKE / OUTPUT: Intake/Output     03/17 0701 - 03/18 0700   P.O. 120   I.V. (mL/kg)    IV Piggyback 150   Total Intake(mL/kg) 270 (3.8)   Urine (mL/kg/hr) 600 (0.6)   Stool    Total Output 600   Net -330         PHYSICAL EXAMINATION: General:  Frail, thin, chronically ill appearing male, lethargic  Neuro:  Lethargic RASS-2 equivalent. Generalized weakness. Oriented X 2 HEENT:  Mm dry, pale, no JVD Cardiovascular:  s1s2 rrr, sternotomy scar  Lungs: decreased  Abdomen:  Soft, nt, +bs Musculoskeletal:  Pale, multiple small pressure ulcers,  + edema bilaterally  LABS:  PULMONARY   CBC  Recent Labs Lab 03/06/14 0330 03/07/14 0519 03/08/14 0405  HGB 11.7* 13.3 12.8*  HCT 35.3* 40.2 38.9*  WBC 6.9 6.2 14.5*  PLT 52* 44* 48*   CARDIAC    Recent Labs Lab 03/03/14 1446 03/03/14 1629 03/03/14 2221 03/04/14 0500 03/08/14 1800  TROPONINI 0.42* 1.71* 11.22* >20.00* 1.04*    Recent Labs Lab 03/05/14 0445 03/08/14 0623  PROBNP 6895.0* 7032.0*   CHEMISTRY  Recent Labs Lab 03/03/14 1629  03/05/14 0445 03/06/14 0330 03/07/14 0519 03/08/14 0405 03/08/14 1800  NA 154*  < > 157* 148* 143 139 142  K 5.3  < > 3.0* 2.8* 3.2* 3.7 2.6*  CL 112  < > 120* 111 104 98 104  CO2 8*  < > 27 28 28 29 28   GLUCOSE 500*  < > 145* 203* 185* 243* 201*  BUN 104*  < > 48* 31* 18 10 10   CREATININE 1.64*  < > 1.06 0.95 0.72 0.72 0.68  CALCIUM 7.9*  < > 7.7* 7.5* 7.7* 7.6* 6.5*  MG 2.3  --  1.7 1.9 1.8 1.6  --   PHOS 4.0  --  0.8* 1.1* 1.5* 3.8  --   < > = values in this interval not displayed. Estimated Creatinine Clearance: 70.7 ml/min (by C-G formula based on Cr of 0.68).  INFECTIOUS  Recent  Labs Lab 03/03/14 1629 03/04/14 0500 03/05/14 0445 03/05/14 0446 03/06/14 0330 03/08/14 1742  LATICACIDVEN 1.8  --  1.6  --   --  1.3  PROCALCITON  --  1.09  --  0.88 0.26  --    ENDOCRINE CBG (last 3)   Recent Labs  03/08/14 1131 03/08/14 1659 03/08/14 1850  GLUCAP 214* 208* 210*   IMAGING x48h  No results found.  ASSESSMENT / PLAN:  PULMONARY A: Protects airway at this time At risk for aspiration P:   Goal SpO2>92 Supplemental oxygen NPO BiPAP contraindicated  CARDIOVASCULAR A: Severe S[facility] Hx HTN, CAD, MI, s/p CABG  - NSTEMI d/t demand ischemia.  - S/p IV heparin. Off pressors   P:  Fluid challenge Ck lactate Goal MAP>65 Hold bb  Cont asa  RENAL A: Acute renal failure (resolved, but at high risk for recurrent injury w/ hypotension) Hypokalemia  P:   Replete K Avoid hypotension Hydrate Renal dose meds rx sepsis  GASTROINTESTINAL A: Cdiff colitis P:  rx PCM  HEMATOLOGIC A: Thrombocytopenia: stable. ? Sepsis vs med related. PLTs improved over last 24 hrs marginally.  P:  Trend CBC Cont LMWH  INFECTIOUS A: Sepsis. Initially enterobacter bacteremia. Now complicated by recurrent sepsis d/t C diff colitis 3/17.  P:   Cont IV flagyl add oral vanc  ENDOCRINE A: DM  DKA (resolved) P:   ssi  Hold basal as NPO  NEUROLOGIC A: Chronic cervical stenosis  Acute encephalopathy likely metabolic (resolved) P:   Supportive care    Now w/ C diff. And related sepsis/septic shock. Moved back to the ICU. Responding to fluid challenge. Will add oral vanc as well.   CC3530min   Dorcas Carrowatrick WrightMD Beeper  [phone]  Cell  [phone]  If no response or cell goes to voicemail, call beeper [phone]  03/08/2014 10:06 PM

## 2014-03-08 NOTE — Progress Notes (Signed)
CRITICAL VALUE ALERT  Critical value received: Cdiff positive  Date of notification:  03/08/14  Time of notification:  1550  Critical value read back:yes  Nurse who received alert:  Casimer BilisSmita Jaquez Farrington  MD notified (1st page):  Rizwan  Time of first page:  1600  MD notified (2nd page):  Time of second page:  Responding MD: Dr Butler Denmarkizwan  Time MD responded:  [phone]

## 2014-03-08 NOTE — Progress Notes (Signed)
Chaplain responded to spiritual consult. Pt was dozing but roused when addressed by name. He said he was okay but sleepy. Pt did not speak much but did say that he "loved" the chaplain. Chaplain showed him a note from his granddaughter which he enjoyed. Chaplain provided caring presence. Will follow up as necessary.

## 2014-03-08 NOTE — Progress Notes (Addendum)
[facility] Progress Note [place] TEAM 1 - Stepdown/ICU TEAM   [name] [name] WUJ:[id number] DOB: 01-09-1929 DOA: 03/03/2014 PCP: No primary provider on file.  Brief narrative: [name] [name] is a 80 y.o. male presenting on 03/03/2014 with  has a past medical history of DM (diabetes mellitus); HTN (hypertension); CAD (coronary artery disease); MI (myocardial infarction); Dementia; Arthritis; Anxiety; and Hyperlipidemia who  presented 3/12 after being found unresponsive. Pt last seen ~5 days prior to admit by family at which time he was reportedly confused, not himself. Neighbors called EMS r/t multiple newspapers outside. Unresponsive, hypotensive, hypothermic on EMS arrival, found in urine and feces with multiple pressure ulcers. ?DKA. BP 80's in ER despite 3L fluids, hypothermic with temp 91.  3/12 Head / c-spine CT >>> nad  03/04/14: Not intubated. On levophed. Drowsy, ? Cheyne stokes breathing but did follow commands and moved all 4s to RN. Metabolic acidosis on aBG. Trop > 20 ; on IV heparin. Cath - clean. LVEDP low   Subjective: Alert, has no complaints- confused- he thinks I have a towel on my shoulder  Assessment/Plan: Principal Problem:   Septic shock / Enterobacter bacteremia - - d/c Vanc and Zosyn and switch to Levaquin for total 10 days  Active Problems:   Acute renal failure--  Metabolic acidosis -due to sepsis and prerenal state - BUN/ Cr 127/ 2.01-  BUN/ Cr normalized  Diarrhea - WBC rising - check for c diff- cont rectal tube    Acute respiratory failure - due to sepsis and decreased level of consciousness resolved    DKA  - resolved- Hba1c 15 - sugars quite high therefore starting Levemir tonight    Encephalopathy acute - resolving    CAD/ NSTEMI with h/o CABG - cardiology following -Cath 3/13 showed severe native 3 vessel CAD with total occlusion of the LM and RCA , patency of the free RIMA-LAD, LIMA-diagonal, and SVG-PDA, total occlusion of the  SVG-LCx (appears chronic) and low LVEDP most consistent with septic/vasodiliatory shock.  -EF preserved by echo.  -medical management of CAD recommended.  - inferior wall hypokinesis on ECHO  Thrombocytopenia - due to sepsis? - follow  Hypokalemia - replace  C collar?  - c spine CT normal- will d/c collar   Code Status: full code Family Communication: none- [name] Polkinhorn- step sister  [name] [name] number in chart but she died yesterday)  Disposition Plan: SNF  Consultants: cardiology   Antibiotics: Antibiotics Given (last 72 hours)   Date/Time Action Medication Dose Rate   03/05/14 1439 Given   piperacillin-tazobactam (ZOSYN) IVPB 3.375 g 3.375 g 12.5 mL/hr   03/05/14 2137 Given   piperacillin-tazobactam (ZOSYN) IVPB 3.375 g 3.375 g 12.5 mL/hr   03/06/14 0502 Given   piperacillin-tazobactam (ZOSYN) IVPB 3.375 g 3.375 g 12.5 mL/hr   03/06/14 1510 Given   piperacillin-tazobactam (ZOSYN) IVPB 3.375 g 3.375 g 12.5 mL/hr   03/06/14 2305 Given  [Not compatible with pepcid]   piperacillin-tazobactam (ZOSYN) IVPB 3.375 g 3.375 g 12.5 mL/hr   03/07/14 0548 Given   piperacillin-tazobactam (ZOSYN) IVPB 3.375 g 3.375 g 12.5 mL/hr   03/07/14 1431 Given   piperacillin-tazobactam (ZOSYN) IVPB 3.375 g 3.375 g 12.5 mL/hr   03/07/14 1544 Given   levofloxacin (LEVAQUIN) IVPB 750 mg 750 mg 100 mL/hr       DVT prophylaxis: Start Lovenox  Objective: Filed Weights   03/06/14 0418 03/06/14 1602 03/08/14 0502  Weight: 69.4 kg (153 lb) 74.934 kg (165 lb 3.2 oz) 70.2 kg (  154 lb 12.2 oz)   Blood pressure 121/87, pulse 105, temperature 98.3 F (36.8 C), temperature source Oral, resp. rate 18, height 5\' 9"  (1.753 m), weight 70.2 kg (154 lb 12.2 oz), SpO2 92.00%.  Intake/Output Summary (Last 24 hours) at 03/08/14 1328 Last data filed at 03/08/14 0918  Gross per 24 hour  Intake 824.16 ml  Output    550 ml  Net 274.16 ml     Exam: General: No acute respiratory distress-   Lungs: Clear to auscultation bilaterally without wheezes or crackles Cardiovascular: Regular rate and rhythm without murmur gallop or rub normal S1 and S2 Abdomen: Nontender, nondistended, soft, bowel sounds positive, no rebound, no ascites, no appreciable mass Extremities: No significant cyanosis, clubbing, or edema bilateral lower extremities  Data Reviewed: Basic Metabolic Panel:  Recent Labs Lab 03/03/14 1629  03/04/14 1500 03/05/14 0445 03/06/14 0330 03/07/14 0519 03/08/14 0405  NA 154*  < > 156* 157* 148* 143 139  K 5.3  < > 3.6* 3.0* 2.8* 3.2* 3.7  CL 112  < > 120* 120* 111 104 98  CO2 8*  < > 24 27 28 28 29   GLUCOSE 500*  < > 244* 145* 203* 185* 243*  BUN 104*  < > 68* 48* 31* 18 10  CREATININE 1.64*  < > 1.14 1.06 0.95 0.72 0.72  CALCIUM 7.9*  < > 7.4* 7.7* 7.5* 7.7* 7.6*  MG 2.3  --   --  1.7 1.9 1.8 1.6  PHOS 4.0  --   --  0.8* 1.1* 1.5* 3.8  < > = values in this interval not displayed. Liver Function Tests:  Recent Labs Lab 03/03/14 1446 03/04/14 0500 03/05/14 0445  AST 16 51* 42*  ALT 18 20 22   ALKPHOS 73 50 65  BILITOT 0.7 0.4 0.4  PROT 5.0* 3.8* 4.1*  ALBUMIN 2.6* 1.8* 1.8*   No results found for this basename: LIPASE, AMYLASE,  in the last 168 hours No results found for this basename: AMMONIA,  in the last 168 hours CBC:  Recent Labs Lab 03/03/14 1446 03/04/14 0515 03/05/14 0445 03/06/14 0330 03/07/14 0519 03/08/14 0405  WBC 6.4 5.4 10.9* 6.9 6.2 14.5*  NEUTROABS 5.2  --  9.4* 6.1 5.6 13.2*  HGB 15.2 12.0* 12.4* 11.7* 13.3 12.8*  HCT 47.7 36.1* 37.7* 35.3* 40.2 38.9*  MCV 95.0 90.0 89.3 89.1 89.3 89.6  PLT 141* 77* 78* 52* 44* 48*   Cardiac Enzymes:  Recent Labs Lab 03/03/14 1446 03/03/14 1629 03/03/14 2221 03/04/14 0500 03/06/14 0330  CKTOTAL 249*  --   --  889* 214  CKMB  --   --   --  49.7* 5.8*  TROPONINI 0.42* 1.71* 11.22* >20.00*  --    BNP (last 3 results)  Recent Labs  03/05/14 0445 03/08/14 0623  PROBNP 6895.0*  7032.0*   CBG:  Recent Labs Lab 03/07/14 1136 03/07/14 1626 03/07/14 2044 03/08/14 0814 03/08/14 1131  GLUCAP 185* 220* 174* 227* 214*    Recent Results (from the past 240 hour(s))  URINE CULTURE     Status: None   Collection Time    03/03/14  5:21 PM      Result Value Ref Range Status   Specimen Description URINE, CATHETERIZED   Final   Special Requests NONE   Final   Culture  Setup Time     Final   Value: 03/03/2014 18:57     Performed at [facility] Count  Final   Value: NO GROWTH     Performed at [facility]   Culture     Final   Value: NO GROWTH     Performed at [facility]   Report Status 03/04/2014 FINAL   Final  CULTURE, BLOOD (ROUTINE X 2)     Status: None   Collection Time    03/03/14  6:40 PM      Result Value Ref Range Status   Specimen Description BLOOD HAND RIGHT   Final   Special Requests BOTTLES DRAWN AEROBIC AND ANAEROBIC 5CCBLUE 4CCRED   Final   Culture  Setup Time     Final   Value: 03/03/2014 21:58     Performed at [facility]   Culture     Final   Value:        BLOOD CULTURE RECEIVED NO GROWTH TO DATE CULTURE WILL BE HELD FOR 5 DAYS BEFORE ISSUING A FINAL NEGATIVE REPORT     Performed at [facility]   Report Status PENDING   Incomplete  CULTURE, BLOOD (ROUTINE X 2)     Status: None   Collection Time    03/03/14  6:52 PM      Result Value Ref Range Status   Specimen Description BLOOD HAND LEFT   Final   Special Requests BOTTLES DRAWN AEROBIC AND ANAEROBIC 5CC   Final   Culture  Setup Time     Final   Value: 03/03/2014 22:01     Performed at [facility]   Culture     Final   Value: ENTEROBACTER AEROGENES     Note: Gram Stain Report Called to,Read Back By and Verified With: PORTER RN AT [phone] 03/05/14 BY SNOLO     Performed at [facility]   Report Status 03/07/2014 FINAL   Final   Organism ID, Bacteria ENTEROBACTER AEROGENES   Final  MRSA PCR SCREENING     Status: None    Collection Time    03/03/14 10:04 PM      Result Value Ref Range Status   MRSA by PCR NEGATIVE  NEGATIVE Final   Comment:            The GeneXpert MRSA Assay (FDA     approved for NASAL specimens     only), is one component of a     comprehensive MRSA colonization     surveillance program. It is not     intended to diagnose MRSA     infection nor to guide or     monitor treatment for     MRSA infections.     Studies:  Recent x-ray studies have been reviewed in detail by the Attending Physician  Scheduled Meds:  Scheduled Meds: . antiseptic oral rinse  15 mL Mouth Rinse q12n4p  . chlorhexidine  15 mL Mouth Rinse BID  . enoxaparin (LOVENOX) injection  40 mg Subcutaneous Q24H  . famotidine (PEPCID) IV  20 mg Intravenous Q12H  . feeding supplement (GLUCERNA SHAKE)  237 mL Oral BID BM  . insulin aspart  0-15 Units Subcutaneous TID WC  . insulin detemir  18 Units Subcutaneous QHS  . levofloxacin (LEVAQUIN) IV  750 mg Intravenous Q24H  . magnesium oxide  400 mg Oral BID   Continuous Infusions: . dextrose 50 mL/hr at 03/08/14 0506    Time spent on care of this patient: 25 min  Calvert Cantor, MD  [facility] Office  [phone] Pager - Text Page per  Amion as per below:  On-Call/Text Page:      [email or web address]  If 7PM-7AM, please contact night-coverage [email or web address] 03/08/2014, 1:28 PM   LOS: 5 days

## 2014-03-08 NOTE — Progress Notes (Signed)
Pt. CVP was initially 2 flat in bed and rechecked with HOB 30 degrees up to 5-6.  Dr. Vassie LollAlva made aware and pt. .maintaning MAP >65. With order just continue NS at 150cc/hr.. And do CVP every 4 hours. Will cont. To monitor pt.

## 2014-03-08 NOTE — Clinical Social Work Psychosocial (Signed)
Clinical Social Work Department BRIEF PSYCHOSOCIAL ASSESSMENT 03/08/2014  Patient:  [name] [name],[name] [name]     Account Number:  [id number]     Admit date:  03/03/2014  Clinical Social Worker:  Lavell LusterAMPBELL,Darilyn Storbeck BRYANT, LCSWA  Date/Time:  03/08/2014 10:30 AM  Referred by:  Physician  Date Referred:  03/08/2014 Referred for  SNF Placement   Other Referral:   Interview type:  Other - See comment Other interview type:   Patient not alert and oriented at time of assessment. No family at bedside. CSW interviewed multiple family members by phone to complete assessment.    PSYCHOSOCIAL DATA Living Status:  ALONE Admitted from facility:   Level of care:   Primary support name:  Blossom HoopsCassandra Marshal Primary support relationship to patient:  FAMILY Degree of support available:   Patient has family in the area but support is limited.    CURRENT CONCERNS Current Concerns  Post-Acute Placement   Other Concerns:    SOCIAL WORK ASSESSMENT / PLAN CSW attempted to assess patient but patient not oriented at time of assessment. CSW contact granddaughter [name], brother [name], son [name], and sister [name] to complete assessment.    Per family patient lives at home alone. Sister [name] states that she went to patient's home today and states that "it looks like a crime scene, there's no way he can go back there." CSW explained to each family member that PT is recommending SNF for patient. All family members said that CSW needs to speak to patient's son [name] to make these decisions. [name] states that he is not willing to help in the situation and will not be making any decisions regarding his father. All family members state that [name] has never been involved in his father's life. Patient's brother [name] was willing to choose a nursing facility but [name] and his wife stated that they will not be willing to complete paperwork at facility. Patient's sister stated that she would be willing to complete paperwork at facility but  backed out while at the facility. Patient's granddaughter states that she will be happy to help in any way but her mother passed away yesterday and will not be available to complete any kind of paperwork until Friday.    CSW explained to all family members that [facility] will not be [facility] unless a family member stepped up to complete admission paperwork. CSW explained that another facility [facility] has offered a bed and is stating that they can be flexible with family regarding admissions process. At this time, CSW will defer to patient's granddaughter as other family members are unwilling to help in getting patient admitted to a SNF. Patient's family (with the exception of granddaughter) seems indifferent about patient going to SNF. Granddaughter [name] was most helpful in planning a disposition for patient.   Assessment/plan status:  Psychosocial Support/Ongoing Assessment of Needs Other assessment/ plan:   Complete FL2, Fax, Pasrr   Information/referral to community resources:   CSW contact information and SNF list left in patient's room and gave contact information to family members.    PATIENT'S/FAMILY'S RESPONSE TO PLAN OF CARE: Patient's granddaughter is only family member willing to make decisions and complete paperwork for patients admission to SNF. ALL family members do feel that it would not be best for patient to return home alone. [name] states that she will help in process but is under a lot of stress due to the recent death of her mother and the arrangements she needs to make  for her mother's funeral. CSW will continue to follow patient for DC needs and support patient and granddaughter with DC planning.       Roddie Mc, [place], [place], [phone]

## 2014-03-08 NOTE — Evaluation (Signed)
Occupational Therapy Evaluation Patient Details Name: [name] [name] MRN: [id number] DOB: [birth year] Today's Date: 03/08/2014 Time: 9562-1308 OT Time Calculation (min): 20 min  OT Assessment / Plan / Recommendation History of present illness 80 y.o. male admitted to [facility] on 03/03/14 with 80 yo male with hx HTN, DM, CAD, MI presented 3/12 after being found unresponsive.  Pt last seen ~5 days prior to admit by family at which time he was reportedly confused, not himself.  Neighbors called EMS d/t multiple newspapers outside.  Unresponsive, hypotensive, hypothermic on EMS arrival, found in urine and fesces with multiple pressure ulcers.  ?DKA. BP 80's in ER despite 3L fluids, hypothermic with temp 91.   The pt was dx with septic shock, acute renal failure, DKA, CAD/NSTEMI with h/o CABG.  Cath 3/13 showed severe native 3 vessel CAD with total occlusion of the LM and RCA , patency of the free RIMA-LAD, LIMA-diagonal, and SVG-PDA, total occlusion of the SVG-LCx (appears chronic) and low LVEDP most consistent with septic/vasodiliatory shock.  He also has a contusion on the left side of his forhead.     Clinical Impression   Pt demos decline in function with ADLs and ADL mobility safety with decreased strength, balance and endurance. Pt would benefit from acute OT services to address impairments to increase level of function and safety. Pt lives at home alone and is currently very deconditioned and unable care for himself, recommend SNF for short term rehab safest d/c option at this time    OT Assessment  Patient needs continued OT Services    Follow Up Recommendations  SNF;Supervision/Assistance - 24 hour    Barriers to Discharge Decreased caregiver support Pt lives at home alone  Equipment Recommendations  None recommended by OT;Other (comment) (TBD at next venue of care)    Recommendations for Other Services    Frequency  Min 2X/week    Precautions / Restrictions Precautions Precautions:  Fall Precaution Comments: very weak and debilitated, fear of falling.  Restrictions Weight Bearing Restrictions: No   Pertinent Vitals/Pain No c/o pain    ADL  Grooming: Performed;Wash/dry hands;Wash/dry face;Minimal assistance Where Assessed - Grooming: Supine, head of bed up;Supported sitting Upper Body Bathing: Simulated;Maximal assistance Where Assessed - Upper Body Bathing: Supported sitting Lower Body Bathing: +1 Total assistance Upper Body Dressing: Performed;Maximal assistance Where Assessed - Upper Body Dressing: Supported sitting Lower Body Dressing: +1 Total assistance Transfers/Ambulation Related to ADLs: Pt unable, will most likey need 2 person assist ADL Comments: pt requires extensive assist with ADLs, pt very weak and deconditioned. Poor sitting balance    OT Diagnosis: Generalized weakness  OT Problem List: Decreased strength;Decreased knowledge of use of DME or AE;Decreased activity tolerance;Impaired balance (sitting and/or standing);Decreased cognition OT Treatment Interventions: Self-care/ADL training;Therapeutic exercise;Patient/family education;Neuromuscular education;Balance training;Therapeutic activities;DME and/or AE instruction   OT Goals(Current goals can be found in the care plan section) ADL Goals Pt Will Perform Grooming: with min assist;sitting (EOB with mod A for balance/support) Pt Will Perform Upper Body Bathing: with mod assist;with min assist;sitting (EOB with mod A for balance/support) Pt Will Perform Lower Body Bathing: with max assist;with mod assist;sitting/lateral leans (EOB with mod A for balance/support) Pt Will Perform Upper Body Dressing: with mod assist;with min assist;sitting (EOB with mod A for balance/support) Pt Will Transfer to Toilet: with total assist;with max assist;bedside commode (pt has rectal tube) Additional ADL Goal #1: Pt will tolerate B UE strengthening exercises with level 1 theraband  sitting EOB with mod  - min A for  balance/support  Additional ADL Goal #2: Pt will complete bed mobility with mod A to sit EOB in prep for ADLs  Visit Information  Last OT Received On: 03/08/14 History of Present Illness: 80 [facility].o. [facility] with 80 yo male with hx HTN, DM, CAD, MI presented 3/12 after being found unresponsive.  Pt last seen ~5 days prior to admit by family at which time he was reportedly confused, not himself.  Neighbors called EMS d/t multiple newspapers outside.  Unresponsive, hypotensive, hypothermic on EMS arrival, found in urine and fesces with multiple pressure ulcers.  ?DKA. BP 80's in ER despite 3L fluids, hypothermic with temp 91.   The pt was dx with septic shock, acute renal failure, DKA, CAD/NSTEMI with h/o CABG.  Cath 3/13 showed severe native 3 vessel CAD with total occlusion of the LM and RCA , patency of the free RIMA-LAD, LIMA-diagonal, and SVG-PDA, total occlusion of the SVG-LCx (appears chronic) and low LVEDP most consistent with septic/vasodiliatory shock.  He also has a contusion on the left side of his forhead.         Prior Functioning     Home Living Family/patient expects to be discharged to:: Private residence Living Arrangements: Alone Type of Home: House Home Access: Stairs to enter Entergy CorporationEntrance Stairs-Number of Steps: 3 Home Layout: One level Home Equipment: Walker - 2 wheels;Cane - single point Additional Comments: pt is a poor historian, hx will need to be confirmed by family.   Prior Function Level of Independence: Independent with assistive device(s) Comments: pt states that he still drives and uses RW sometimes Communication Communication: HOH Dominant Hand: Left         Vision/Perception Vision - History Baseline Vision: Wears glasses only for reading Patient Visual Report: No change from baseline Perception Perception: Within Functional Limits   Cognition  Cognition Arousal/Alertness: Awake/alert Behavior During Therapy: WFL for tasks  assessed/performed Overall Cognitive Status: Impaired/Different from baseline Area of Impairment: Orientation;Attention;Memory;Following commands;Safety/judgement;Awareness;Problem solving Orientation Level: Time;Situation;Disoriented to Current Attention Level: Focused Memory: Decreased short-term memory Following Commands: Follows one step commands inconsistently;Follows one step commands with increased time Safety/Judgement: Decreased awareness of deficits;Decreased awareness of safety Problem Solving: Slow processing;Decreased initiation;Requires verbal cues;Requires tactile cues General Comments: Pt is slow tp process commands and demos decreased attention    Extremity/Trunk Assessment Upper Extremity Assessment Upper Extremity Assessment: Generalized weakness Lower Extremity Assessment Lower Extremity Assessment: Defer to PT evaluation Cervical / Trunk Assessment Cervical / Trunk Assessment: Kyphotic     Mobility Bed Mobility Overal bed mobility: Needs Assistance Bed Mobility: Supine to Sit;Sit to Supine Supine to sit: Total assist;HOB elevated Sit to supine: Total assist General bed mobility comments: assist required for B LEs and trunk, cues to use rails. Posterior leaning, fear of falling Transfers General transfer comment: Pt unable, will most likey need 2 person assist     Exercise     Balance Balance Overall balance assessment: Needs assistance Sitting-balance support: Bilateral upper extremity supported;No upper extremity supported;Feet supported Sitting balance-Leahy Scale: Poor Sitting balance - Comments: max A for balance/support sitting EOB. Posterior lean   End of Session OT - End of Session Activity Tolerance: Patient limited by fatigue Patient left: in bed;with call bell/phone within reach  GO     Galen ManilaSpencer, Nelvin Tomb Jeanette 03/08/2014, 1:04 PM

## 2014-03-08 NOTE — Progress Notes (Signed)
Patient BP is 70's  Post bolus called MD made aware and second bolus started with pressure bag.

## 2014-03-08 NOTE — Progress Notes (Signed)
NUTRITION FOLLOW-UP  DOCUMENTATION CODES  Per approved criteria   -Severe malnutrition in the context of acute illness   INTERVENTION: Agree with SLP consult to evaluate patient's need for thickened liquids. Add Glucerna Shake po BID, each supplement provides 220 kcal and 10 grams of protein. Please thicken to appropriate consistency. RD to continue to follow nutrition care plan.  NUTRITION DIAGNOSIS: Inadequate oral intake now r/t AMS and variable appetite AEB limited po intake.  Goal: Patient will meet >/=90% of estimated nutrition needs  Monitor:  Diet advancement, weight, labs, I/Os  ASSESSMENT: 80 year old male with history of hypertension, diabetes, CAD, MI, found unresponsive. Found to have SIRS, sepsis, acute renal failure, DKA, acute encephalopathy, and acute NSTEMI.   Pt has required potassium and phosphorus repletion. No WNL.  Pt was living home alone PTA, per most recent PT assessment: "The pt is very weak and deconditioned and is currently requiring total assist to even sit EOB. He was tachy with rates into the 140s just attempting to sit on the side of the bed. We could not even attempt standing. He would benefit from SNF placement at d/c because between his physical immobility and his cognition he is not safe to return home at this time."   Per cardiology, pt with LE edema - possible need for diuresis for volume overload. BLE edema on exam, weight up 23 lbs from admission but was also likely very dehydrated on admission. Baseline weight not clear.  Ordered for Heart Healthy diet with Carbohydrate Modified Medium diet with nectar-thickened liquids. Pt has not been seen by SLP yet (consult pending), MD changed pt to Nectar-Thickened liquids on 3/16. Consumed 75% of breakfast this morning, however records indicate that this is the first time pt has eaten >50% since admission. Pt unable to answer any of my questions, obvious muscle wasting.  Nutrition Focused Physical  Exam:  Subcutaneous Fat:  Orbital Region: moderate depletion Upper Arm Region: n/a Thoracic and Lumbar Region: n/a  Muscle:  Temple Region: severe depletion Clavicle Bone Region: severe depletion Clavicle and Acromion Bone Region: severe depletion Scapular Bone Region: n/a Dorsal Hand: WNL Patellar Region: n/a Anterior Thigh Region: n/a Posterior Calf Region: n/a  Edema: LE edema  Pt meets criteria for severe MALNUTRITION in the context of acute illness as evidenced by intake of <50% x at least 5 days and severe muscle depletion.    Height: Ht Readings from Last 1 Encounters:  03/03/14 5\' 9"  (1.753 m)    Weight: Wt Readings from Last 1 Encounters:  03/08/14 154 lb 12.2 oz (70.2 kg)  Admit wt 142 lb  BMI:  Body mass index is 22.84 kg/(m^2). Patient is normal weight.  Estimated Nutritional Needs: Kcal: 1650-1800 kcal Protein: 85-95 g Fluid: >2.1 L/day  Skin:  stage I pressure ulcers sacrum and heel stage II pressure ulcers coccyx, hip,   Diet Order:     Intake/Output Summary (Last 24 hours) at 03/08/14 1018 Last data filed at 03/08/14 0918  Gross per 24 hour  Intake 1680.83 ml  Output   1250 ml  Net 430.83 ml    Last BM: 3/16 diarrhea via rectal tube  Labs:   Recent Labs Lab 03/06/14 0330 03/07/14 0519 03/08/14 0405  NA 148* 143 139  K 2.8* 3.2* 3.7  CL 111 104 98  CO2 28 28 29   BUN 31* 18 10  CREATININE 0.95 0.72 0.72  CALCIUM 7.5* 7.7* 7.6*  MG 1.9 1.8 1.6  PHOS 1.1* 1.5* 3.8  GLUCOSE  203* 185* 243*    CBG (last 3)   Recent Labs  03/07/14 1626 03/07/14 2044 03/08/14 0814  GLUCAP 220* 174* 227*    Scheduled Meds: . antiseptic oral rinse  15 mL Mouth Rinse q12n4p  . chlorhexidine  15 mL Mouth Rinse BID  . enoxaparin (LOVENOX) injection  40 mg Subcutaneous Q24H  . famotidine (PEPCID) IV  20 mg Intravenous Q12H  . insulin aspart  0-15 Units Subcutaneous TID WC  . levofloxacin (LEVAQUIN) IV  750 mg Intravenous Q24H     Continuous Infusions: . dextrose 50 mL/hr at 03/08/14 0506    Jarold Motto MS, RD, LDN Inpatient Registered Dietitian Pager: [phone] After-hours pager: [phone]

## 2014-03-09 DIAGNOSIS — A419 Sepsis, unspecified organism: Secondary | ICD-10-CM

## 2014-03-09 LAB — MAGNESIUM: Magnesium: 1.4 mg/dL — ABNORMAL LOW (ref 1.5–2.5)

## 2014-03-09 LAB — GLUCOSE, CAPILLARY
GLUCOSE-CAPILLARY: 109 mg/dL — AB (ref 70–99)
GLUCOSE-CAPILLARY: 111 mg/dL — AB (ref 70–99)
GLUCOSE-CAPILLARY: 137 mg/dL — AB (ref 70–99)
Glucose-Capillary: 86 mg/dL (ref 70–99)
Glucose-Capillary: 110 mg/dL — ABNORMAL HIGH (ref 70–99)

## 2014-03-09 LAB — CULTURE, BLOOD (ROUTINE X 2): Culture: NO GROWTH

## 2014-03-09 LAB — BASIC METABOLIC PANEL
BUN: 10 mg/dL (ref 6–23)
CALCIUM: 7.2 mg/dL — AB (ref 8.4–10.5)
CO2: 25 mEq/L (ref 19–32)
CREATININE: 0.63 mg/dL (ref 0.50–1.35)
Chloride: 104 mEq/L (ref 96–112)
GFR calc Af Amer: >90 mL/min (ref >90)
GFR calc non Af Amer: 89 mL/min — ABNORMAL LOW (ref >90)
GLUCOSE: 105 mg/dL — AB (ref 70–99)
Potassium: 3.4 mEq/L — ABNORMAL LOW (ref 3.7–5.3)
Sodium: 140 mEq/L (ref 137–147)

## 2014-03-09 LAB — TROPONIN I
TROPONIN I: 0.8 ng/mL — AB (ref <0.30)
Troponin I: 0.79 ng/mL (ref <0.30)
Troponin I: 0.74 ng/mL (ref <0.30)

## 2014-03-09 LAB — PHOSPHORUS: Phosphorus: 1.4 mg/dL — ABNORMAL LOW (ref 2.3–4.6)

## 2014-03-09 MED ORDER — SODIUM CHLORIDE 0.9 % IV BOLUS (SEPSIS)
750.0000 mL | Freq: Once | INTRAVENOUS | Status: AC
  Administered 2014-03-09: 750 mL via INTRAVENOUS

## 2014-03-09 NOTE — Progress Notes (Signed)
Inpatient Diabetes Program Recommendations  AACE/ADA: New Consensus Statement on Inpatient Glycemic Control  Target Ranges:  Prepandial:   less than 140 mg/dL      Peak postprandial:   less than 180 mg/dL (1-2 hours)      Critically ill patients:  140 - 180 mg/dL  Pager:  [phone] Hours:  8 am-10pm   Reason for Visit: Elevated HgbA1C- 15%  Inpatient Diabetes Program Recommendations HgbA1C: HgbA1C 15%-  Consider adjusting home medications due to uncontrolled glucose.  [name] Clientrissy Marysol Wellnitz PhD, RN, BC-ADM Diabetes Coordinator  Office:  [phone] Team Pager:  [phone]

## 2014-03-09 NOTE — Progress Notes (Signed)
Name: [name] [name] MRN: [id number] DOB: 14-Oct-1929    ADMISSION DATE:  03/03/2014  REFERRING MD :  EDP PRIMARY SERVICE: PCCM  CHIEF COMPLAINT:  Found down  BRIEF PATIENT DESCRIPTION:    80 yo male with hx HTN, DM, CAD, MI presented 3/12 after being found unresponsive.  Pt last seen ~5 days prior to admit by family at which time he was reportedly confused, not himself.  Neighbors called EMS r/t multiple newspapers outside.  Unresponsive, hypotensive, hypothermic on EMS arrival, found in urine and fesces with multiple pressure ulcers.  ?DKA. BP 80's in ER despite 3L fluids, hypothermic with temp 91.   LINES / TUBES: L IJ CVL 3/12 >>> Foley 3/12 >>>  CULTURES: 3/12  Blood >>> enterobacter  3/12  Urine >>> neg  3/12 MRSA PCR - negative 3/17: c diff: positive   ANTIBIOTICS: Vancomycin 3/12>>>3/15 Zosyn 3/12 >>>3/17 levaquin 3/17>>> Flagyl 3/17>>> Oral vanc 3/17>>>  SIGNIFICANT EVENTS / STUDIES:  3/12  Head / c-spine CT >>> nad 03/04/14: Not intubated. On levophed. Drowsy, ? Cheyne stokes breathing but did follow commands and moved all 4s to RN.  Metabolic acidosis on aBG. Trop > 20 ; on IV heparin.-Cath Cath 3/13 showed severe native 3 vessel CAD with total occlusion of the LM and RCA , patency of the free RIMA-LAD, LIMA-diagonal, and SVG-PDA, total occlusion of the SVG-LCx (appears chronic) and low LVEDP most consistent with septic/vasodiliatory shock 03/05/14 Remains unintubated. Off pressors x 4h. Drowsy but more responsive than yesterday: says he is "I am better" 3/15: moved to med ward and transferred to IM service.  3/16 c collar d/c'd 3/17: diarrhea rising WBC C diff +. BP dropping in PM hours down into 60s. 3 liters NS w/ BP slow to respond so moved back to ICU and PCCM re-assumed care.   SUBJECTIVE/OVERNIGHT/INTERVAL HX More awake, borderline BP  VITAL SIGNS: Temp:  [97.7 F (36.5 C)-100.3 F (37.9 C)] 97.7 F (36.5 C) (03/18 1307) Pulse Rate:  [41-116] 96  (03/18 1400) Resp:  [14-25] 24 (03/18 1400) BP: (60-136)/(32-80) 85/60 mmHg (03/18 1400) SpO2:  [91 %-100 %] 96 % (03/18 1400) Weight:  [71 kg (156 lb 8.4 oz)] 71 kg (156 lb 8.4 oz) (03/18 0500)  HEMODYNAMICS: CVP:  [4 mmHg-7 mmHg] 7 mmHg VENTILATOR SETTINGS:   INTAKE / OUTPUT: Intake/Output     03/17 0701 - 03/18 0700 03/18 0701 - 03/19 0700   P.O. 120    I.V. (mL/kg) 1800 (25.4) 1200 (16.9)   NG/GT 100    IV Piggyback 400 100   Total Intake(mL/kg) 2420 (34.1) 1300 (18.3)   Urine (mL/kg/hr) 1310 (0.8) 900 (1.6)   Stool 175 (0.1)    Total Output 1485 900   Net +935 +400          PHYSICAL EXAMINATION: General:  Frail, thin, chronically ill appearing male, more awake Neuro:  Generalized weakness. Oriented X 2 HEENT:  Mm dry, pale, no JVD Cardiovascular:  s1s2 rrr, sternotomy scar  Lungs: decreased CTA Abdomen:  Soft, nt, +bs, no r/g Musculoskeletal:  Pale, multiple small pressure ulcers,  + edema bilaterally  LABS:  PULMONARY   CBC  Recent Labs Lab 03/06/14 0330 03/07/14 0519 03/08/14 0405  HGB 11.7* 13.3 12.8*  HCT 35.3* 40.2 38.9*  WBC 6.9 6.2 14.5*  PLT 52* 44* 48*   CARDIAC    Recent Labs Lab 03/03/14 1629 03/03/14 2221 03/04/14 0500 03/08/14 1800 03/09/14 0900  TROPONINI 1.71* 11.22* >20.00* 1.04* 0.80*  Recent Labs Lab 03/05/14 0445 03/08/14 0623  PROBNP 6895.0* 7032.0*   CHEMISTRY  Recent Labs Lab 03/03/14 1629  03/05/14 0445 03/06/14 0330 03/07/14 0519 03/08/14 0405 03/08/14 1800  NA 154*  < > 157* 148* 143 139 142  K 5.3  < > 3.0* 2.8* 3.2* 3.7 2.6*  CL 112  < > 120* 111 104 98 104  CO2 8*  < > 27 28 28 29 28   GLUCOSE 500*  < > 145* 203* 185* 243* 201*  BUN 104*  < > 48* 31* 18 10 10   CREATININE 1.64*  < > 1.06 0.95 0.72 0.72 0.68  CALCIUM 7.9*  < > 7.7* 7.5* 7.7* 7.6* 6.5*  MG 2.3  --  1.7 1.9 1.8 1.6  --   PHOS 4.0  --  0.8* 1.1* 1.5* 3.8  --   < > = values in this interval not displayed. Estimated Creatinine  Clearance: 71.2 ml/min (by C-G formula based on Cr of 0.68).  INFECTIOUS  Recent Labs Lab 03/03/14 1629 03/04/14 0500 03/05/14 0445 03/05/14 0446 03/06/14 0330 03/08/14 1742  LATICACIDVEN 1.8  --  1.6  --   --  1.3  PROCALCITON  --  1.09  --  0.88 0.26  --    ENDOCRINE CBG (last 3)   Recent Labs  03/09/14 0415 03/09/14 0810 03/09/14 1146  GLUCAP 111* 137* 86   IMAGING x48h  Dg Abd Portable 1v  03/09/2014   CLINICAL DATA:  Feeding tube placement.  EXAM: PORTABLE ABDOMEN - 1 VIEW  COMPARISON:  Single view of the abdomen 03/05/2014.  FINDINGS: Feeding tube is in place with the tip in the stomach.  IMPRESSION: As above.   Electronically Signed   By: Drusilla Kannerhomas  Dalessio M.D.   On: 03/09/2014 00:18    ASSESSMENT / PLAN:  PULMONARY A: Protects airway at this time- more alert P:   Goal SpO2>92 Supplemental oxygen  CARDIOVASCULAR A: Severe [facility] Hx HTN, CAD, MI, s/p CABG  - NSTEMI d/t demand ischemia.  - S/p IV heparin. Off pressors   P:  Fluid challenge responded well Ck lactate re assuring, perfusing well Goal MAP>60 Hold bb  Cont asa Add levophed if MAp drops Assess cvp, svo2  RENAL A: Acute renal failure (resolved, but at high risk for recurrent injury w/ hypotension) Hypokalemia  P:   k recheck - NOW Hydrate maintain rx sepsis  GASTROINTESTINAL A: Cdiff colitis P:  see ID Avoid ppi Start diet if swallow intact slp  HEMATOLOGIC A: Thrombocytopenia: stable. ? Sepsis vs med related. PLTs improved over last 24 hrs marginally.  P:  Trend CBC dc LMWH scd addition, follow plat trend  INFECTIOUS A: Sepsis. Initially enterobacter bacteremia. Now complicated by recurrent sepsis d/t C diff colitis 3/17.  P:   Cont IV flagyl add oral vanc and maintain 500 q6h Levo limit duration to 8 days in setting cdiff  ENDOCRINE A: DM  DKA (resolved) P:   ssi  Hold basal as NPO  NEUROLOGIC A: Chronic cervical stenosis   Acute encephalopathy likely metabolic (resolved) P:   Supportive care improved   Ccm time 30 min   Mcarthur Rossettianiel J. Tyson AliasFeinstein, MD, FACP Pgr: [phone] [facility]

## 2014-03-09 NOTE — Progress Notes (Signed)
       Patient Name: [name] GustRobert [name] Date of Encounter: 03/09/2014    SUBJECTIVE:No complaints. In reverse [place]rend[place]berg.  TELEMETRY:   NSR/ST Filed Vitals:   03/09/14 0400 03/09/14 0446 03/09/14 0500 03/09/14 0600  BP: 84/59  93/55 90/59  Pulse: 94  97 98  Temp:  97.8 F (36.6 C)    TempSrc:  Oral    Resp: 21   24  Height:      Weight:   156 lb 8.4 oz (71 kg)   SpO2: 96%  95% 93%    Intake/Output Summary (Last 24 hours) at 03/09/14 0853 Last data filed at 03/09/14 0600  Gross per 24 hour  Intake   2420 ml  Output   1485 ml  Net    935 ml    LABS: Basic Metabolic Panel:  Recent Labs  40/98/1103/16/15 0519 03/08/14 0405 03/08/14 1800  NA 143 139 142  K 3.2* 3.7 2.6*  CL 104 98 104  CO2 28 29 28   GLUCOSE 185* 243* 201*  BUN 18 10 10   CREATININE 0.72 0.72 0.68  CALCIUM 7.7* 7.6* 6.5*  MG 1.8 1.6  --   PHOS 1.5* 3.8  --    CBC:  Recent Labs  03/07/14 0519 03/08/14 0405  WBC 6.2 14.5*  NEUTROABS 5.6 13.2*  HGB 13.3 12.8*  HCT 40.2 38.9*  MCV 89.3 89.6  PLT 44* 48*   Cardiac Enzymes:  Recent Labs  03/08/14 1800  TROPONINI 1.04*   BNP (last 3 results)  Recent Labs  03/05/14 0445 03/08/14 0623  PROBNP 6895.0* 7032.0*   Hemoglobin A1C:  Recent Labs  03/08/14 0405  HGBA1C 15.0*   Fasting Lipid Panel: No results found for this basename: CHOL, HDL, LDLCALC, TRIG, CHOLHDL, LDLDIRECT,  in the last 72 hours  Radiology/Studies:  No new data  Physical Exam: Blood pressure 90/59, pulse 98, temperature 97.8 F (36.6 C), temperature source Oral, resp. rate 24, height 5\' 9"  (1.753 m), weight 156 lb 8.4 oz (71 kg), SpO2 93.00%. Weight change: 1 lb 12.2 oz (0.8 kg)   No edema  No rub or murmur Lethargic  ASSESSMENT:  1. 3 Vessel CAD with NSTEMI, type 2 2. Recurrent sepsis and shock  Plan:  1. Follow serial ECG's and Troponin 2. No active CV recommendations 3. We will continue to follow.  Selinda EonSigned, Psalms Olarte III,Lutie Pickler W 03/09/2014, 8:53 AM

## 2014-03-10 ENCOUNTER — Inpatient Hospital Stay (HOSPITAL_COMMUNITY): Payer: Medicare HMO

## 2014-03-10 DIAGNOSIS — A0472 Enterocolitis due to Clostridium difficile, not specified as recurrent: Secondary | ICD-10-CM | POA: Diagnosis not present

## 2014-03-10 LAB — GLUCOSE, CAPILLARY
GLUCOSE-CAPILLARY: 126 mg/dL — AB (ref 70–99)
GLUCOSE-CAPILLARY: 241 mg/dL — AB (ref 70–99)
GLUCOSE-CAPILLARY: 288 mg/dL — AB (ref 70–99)
Glucose-Capillary: 107 mg/dL — ABNORMAL HIGH (ref 70–99)
Glucose-Capillary: 113 mg/dL — ABNORMAL HIGH (ref 70–99)
Glucose-Capillary: 122 mg/dL — ABNORMAL HIGH (ref 70–99)
Glucose-Capillary: 178 mg/dL — ABNORMAL HIGH (ref 70–99)

## 2014-03-10 LAB — CBC WITH DIFFERENTIAL/PLATELET
Basophils Absolute: 0 10*3/uL (ref 0.0–0.1)
Basophils Relative: 0 % (ref 0–1)
EOS PCT: 0 % (ref 0–5)
Eosinophils Absolute: 0 10*3/uL (ref 0.0–0.7)
HCT: 33.3 % — ABNORMAL LOW (ref 39.0–52.0)
Hemoglobin: 11.1 g/dL — ABNORMAL LOW (ref 13.0–17.0)
LYMPHS PCT: 4 % — AB (ref 12–46)
Lymphs Abs: 0.4 10*3/uL — ABNORMAL LOW (ref 0.7–4.0)
MCH: 29.9 pg (ref 26.0–34.0)
MCHC: 33.3 g/dL (ref 30.0–36.0)
MCV: 89.8 fL (ref 78.0–100.0)
Monocytes Absolute: 0.3 10*3/uL (ref 0.1–1.0)
Monocytes Relative: 3 % (ref 3–12)
NEUTROS ABS: 8.5 10*3/uL — AB (ref 1.7–7.7)
Neutrophils Relative %: 93 % — ABNORMAL HIGH (ref 43–77)
PLATELETS: 75 10*3/uL — AB (ref 150–400)
RBC: 3.71 MIL/uL — ABNORMAL LOW (ref 4.22–5.81)
RDW: 14.3 % (ref 11.5–15.5)
WBC: 9.1 10*3/uL (ref 4.0–10.5)

## 2014-03-10 LAB — COMPREHENSIVE METABOLIC PANEL
ALK PHOS: 128 U/L — AB (ref 39–117)
ALT: 16 U/L (ref 0–53)
AST: 21 U/L (ref 0–37)
Albumin: 1.3 g/dL — ABNORMAL LOW (ref 3.5–5.2)
BILIRUBIN TOTAL: 0.4 mg/dL (ref 0.3–1.2)
BUN: 12 mg/dL (ref 6–23)
CHLORIDE: 103 meq/L (ref 96–112)
CO2: 23 meq/L (ref 19–32)
Calcium: 7 mg/dL — ABNORMAL LOW (ref 8.4–10.5)
Creatinine, Ser: 0.57 mg/dL (ref 0.50–1.35)
GFR calc non Af Amer: >90 mL/min (ref >90)
GLUCOSE: 134 mg/dL — AB (ref 70–99)
POTASSIUM: 3.2 meq/L — AB (ref 3.7–5.3)
Sodium: 139 mEq/L (ref 137–147)
Total Protein: 4.2 g/dL — ABNORMAL LOW (ref 6.0–8.3)

## 2014-03-10 MED ORDER — POTASSIUM PHOSPHATE DIBASIC 3 MMOLE/ML IV SOLN
30.0000 mmol | Freq: Once | INTRAVENOUS | Status: AC
  Administered 2014-03-10: 30 mmol via INTRAVENOUS
  Filled 2014-03-10: qty 10

## 2014-03-10 NOTE — Progress Notes (Signed)
SLP cognitive-linguistic evaluation completed in error 03/08/14. Patient with metabolic encephalopathy due to sepsis resulting in AMS. Unlikely to benefit from skilled SLP intervention for cognition at this time and suspect clearing of mentation with clearing infection. Do agree that patient will need 24 hour supervision if mentation does not significantly improve.   Ferdinand LangoLeah Braylea Brancato MA, CCC-SLP [phone]

## 2014-03-10 NOTE — Progress Notes (Signed)
ANTIBIOTIC CONSULT NOTE - FOLLOW UP  Pharmacy Consult for Levaquin Indication: Sepsis  No Known Allergies  Patient Measurements: Height: 5\' 9"  (175.3 cm) Weight: 167 lb 8.8 oz (76 kg) IBW/kg (Calculated) : 70.7 Adjusted Body Weight:    Vital Signs: Temp: 98.7 F (37.1 C) (03/19 0343) Temp src: Oral (03/19 0343) BP: 129/49 mmHg (03/19 0343) Pulse Rate: 94 (03/19 0343) Intake/Output from previous day: 03/18 0701 - 03/19 0700 In: 5215 [P.O.:300; I.V.:3565; IV Piggyback:1350] Out: 1550 [Urine:1300; Stool:250] Intake/Output from this shift:    Labs:  Recent Labs  03/08/14 0405 03/08/14 1800 03/09/14 1525 03/10/14 0835  WBC 14.5*  --   --  9.1  HGB 12.8*  --   --  11.1*  PLT 48*  --   --  75*  CREATININE 0.72 0.68 0.63 0.57   Estimated Creatinine Clearance: 71.2 ml/min (by C-G formula based on Cr of 0.57). No results found for this basename: VANCOTROUGH, Leodis Binet, VANCORANDOM, GENTTROUGH, GENTPEAK, GENTRANDOM, TOBRATROUGH, TOBRAPEAK, TOBRARND, AMIKACINPEAK, AMIKACINTROU, AMIKACIN,  in the last 72 hours   Microbiology: Recent Results (from the past 720 hour(s))  URINE CULTURE     Status: None   Collection Time    03/03/14  5:21 PM      Result Value Ref Range Status   Specimen Description URINE, CATHETERIZED   Final   Special Requests NONE   Final   Culture  Setup Time     Final   Value: 03/03/2014 18:57     Performed at [facility] Count     Final   Value: NO GROWTH     Performed at [facility]   Culture     Final   Value: NO GROWTH     Performed at [facility]   Report Status 03/04/2014 FINAL   Final  CULTURE, BLOOD (ROUTINE X 2)     Status: None   Collection Time    03/03/14  6:40 PM      Result Value Ref Range Status   Specimen Description BLOOD HAND RIGHT   Final   Special Requests BOTTLES DRAWN AEROBIC AND ANAEROBIC 5CCBLUE 4CCRED   Final   Culture  Setup Time     Final   Value: 03/03/2014 21:58     Performed at  [facility]   Culture     Final   Value: NO GROWTH 5 DAYS     Performed at [facility]   Report Status 03/09/2014 FINAL   Final  CULTURE, BLOOD (ROUTINE X 2)     Status: None   Collection Time    03/03/14  6:52 PM      Result Value Ref Range Status   Specimen Description BLOOD HAND LEFT   Final   Special Requests BOTTLES DRAWN AEROBIC AND ANAEROBIC 5CC   Final   Culture  Setup Time     Final   Value: 03/03/2014 22:01     Performed at [facility]   Culture     Final   Value: ENTEROBACTER AEROGENES     Note: Gram Stain Report Called to,Read Back By and Verified With: PORTER RN AT [phone] 03/05/14 BY SNOLO     Performed at [facility]   Report Status 03/07/2014 FINAL   Final   Organism ID, Bacteria ENTEROBACTER AEROGENES   Final  MRSA PCR SCREENING     Status: None   Collection Time    03/03/14 10:04 PM  Result Value Ref Range Status   MRSA by PCR NEGATIVE  NEGATIVE Final   Comment:            The GeneXpert MRSA Assay (FDA     approved for NASAL specimens     only), is one component of a     comprehensive MRSA colonization     surveillance program. It is not     intended to diagnose MRSA     infection nor to guide or     monitor treatment for     MRSA infections.  CLOSTRIDIUM DIFFICILE BY PCR     Status: Abnormal   Collection Time    03/08/14  2:32 PM      Result Value Ref Range Status   C difficile by pcr POSITIVE (*) NEGATIVE Final   Comment: CRITICAL RESULT CALLED TO, READ BACK BY AND VERIFIED WITH:     GLOSSON RN 15:45 03/08/14 ([email or web address])    Anti-infectives   Start     Dose/Rate Route Frequency Ordered Stop   03/09/14 0000  vancomycin (VANCOCIN) 50 mg/mL oral solution 500 mg     500 mg Per Tube 4 times per day 03/08/14 2235     03/08/14 1700  metroNIDAZOLE (FLAGYL) IVPB 500 mg     500 mg 100 mL/hr over 60 Minutes Intravenous Every 8 hours 03/08/14 1600     03/07/14 1600  levofloxacin (LEVAQUIN) IVPB 750 mg     750 mg 100 mL/hr  over 90 Minutes Intravenous Every 24 hours 03/07/14 1519 03/10/14 2359   03/04/14 1800  vancomycin (VANCOCIN) IVPB 750 mg/150 ml premix  Status:  Discontinued     750 mg 150 mL/hr over 60 Minutes Intravenous Every 24 hours 03/03/14 2131 03/05/14 1427   03/04/14 0100  piperacillin-tazobactam (ZOSYN) IVPB 3.375 g  Status:  Discontinued     3.375 g 12.5 mL/hr over 240 Minutes Intravenous 3 times per day 03/03/14 2131 03/07/14 1502   03/03/14 1800  vancomycin (VANCOCIN) IVPB 1000 mg/200 mL premix     1,000 mg 200 mL/hr over 60 Minutes Intravenous  Once 03/03/14 1749 03/09/14 1848   03/03/14 1800  piperacillin-tazobactam (ZOSYN) IVPB 3.375 g     3.375 g 100 mL/hr over 30 Minutes Intravenous  Once 03/03/14 1750 03/09/14 1848      Assessment: 84 YOM found unresponsive by EMS (estimated down for up to 5 days) in sepsis.  PMH: HTN, DM, CAD, MI, dementia, HLD,   Anticoagulation: s/p Heparin for NSTEMI.  Hgb 12.8>>11.1. Plts 48 now up to 75 - d/c SQ hep (4T score = 3). On SCD's  ID: Abx D #7/8 for Enterobacter bacteremia, Cdiff treatment d#3. Tmax 99.6 , WBC 9.1 down. Renal function stable.  Vanc 3/12 >>3/14 Zosyn 3/12 >>3/16 LVQ 3/16 >>3/19 Flagyl 3/17 >> Po Vanc 3/18 >>   3/12 Blood >>1/2 Enterobacter, pan ss x Cefazolin 3/12 Urine >>neg 3/12 MRSA neg 3/17 CDiff +   Plan:  Levaquin 750mg  IV q24h dose ok. Pharmacy will sign off today.   Marlen Koman S. Merilynn Finlandobertson, PharmD, BCPS Clinical Staff Pharmacist Pager [phone]  Misty Stanleyobertson, Dana Debo Stillinger 03/10/2014,9:42 AM

## 2014-03-10 NOTE — Progress Notes (Signed)
In to visit with patient at bedside.  He continues to be very weak and Chiropractorasked writer for a ride to the store to get a hamburger.  Collaborated with RNCM regarding his discharge plan.  His family has been difficult to reach and he his anticipated to go to SNF for long term stay.  If he discharges to SNF for long term he will not be [facility] eligible.  However if he goes home or to ALF at any point he may benefit from close management.  Will continue to monitor for any change is his disposition.  Of note, [facility] services does not replace or interfere with any services that are arranged by inpatient case management or social work.  For additional questions or referrals please contact [name] Hendersonim [name] BSN RN [facility] at [phone].

## 2014-03-10 NOTE — Progress Notes (Signed)
Name: [name] GustRobert Haubner MRN: [id number] DOB: 06/07/1929    ADMISSION DATE:  03/03/2014  REFERRING MD :  EDP PRIMARY SERVICE: PCCM  CHIEF COMPLAINT:  Found down  BRIEF PATIENT DESCRIPTION:     80 yo male with hx HTN, DM, CAD, MI presented 3/12 after being found unresponsive.  Pt last seen ~5 days prior to admit by family at which time he was reportedly confused, not himself.  Neighbors called EMS r/t multiple newspapers outside.  Unresponsive, hypotensive, hypothermic on EMS arrival, found in urine and fesces with multiple pressure ulcers.  ?DKA. BP 80's in ER despite 3L fluids, hypothermic with temp 91.   LINES / TUBES: L IJ CVL 3/12 >>> Foley 3/12 >>>  CULTURES: 3/12  Blood >>> enterobacter  3/12  Urine >>> neg  3/12 MRSA PCR - negative 3/17: c diff: positive   ANTIBIOTICS: Vancomycin 3/12>>>3/15 Zosyn 3/12 >>>3/17 levaquin 3/17>>> Flagyl 3/17>>> Oral vanc 3/17>>>  SIGNIFICANT EVENTS / STUDIES:  3/12  Head / c-spine CT >>> nad 03/04/14: Not intubated. On 7mcg levophed. Drowsy, ? Cheyne stokes breathing but did follow commands and moved all 4s to RN.  Metabolic acidosis on aBG. Trop > 20 ; on IV heparin.-Cath Cath 3/13 showed severe native 3 vessel CAD with total occlusion of the LM and RCA , patency of the free RIMA-LAD, LIMA-diagonal, and SVG-PDA, total occlusion of the SVG-LCx (appears chronic) and low LVEDP most consistent with septic/vasodiliatory shock 03/05/14 Remains unintubated. Off pressors x 4h. Drowsy but more responsive than yesterday: says he is "I am better" 3/15: moved to med ward and transferred to IM service.  3/16 c collar d/c'd 3/17: diarrhea rising WBC C diff +. BP dropping in PM hours down into 60s. 3 liters NS w/ BP slow to respond so moved back to ICU and PCCM re-assumed care.  3/18: transfer back to med ward.   SUBJECTIVE/OVERNIGHT/INTERVAL HX Awake alert, some confusion, BP stable off pressors  VITAL SIGNS: Temp:  [98.2 F (36.8 C)-99.6 F (37.6  C)] 98.7 F (37.1 C) (03/19 0343) Pulse Rate:  [79-96] 94 (03/19 0343) Resp:  [20-24] 20 (03/19 0343) BP: (85-129)/(49-76) 129/49 mmHg (03/19 0343) SpO2:  [95 %-98 %] 98 % (03/19 0343) Weight:  [76 kg (167 lb 8.8 oz)-76.1 kg (167 lb 12.3 oz)] 76 kg (167 lb 8.8 oz) (03/19 0555)  HEMODYNAMICS: CVP:  [3 mmHg] 3 mmHg VENTILATOR SETTINGS:   INTAKE / OUTPUT: Intake/Output     03/18 0701 - 03/19 0700 03/19 0701 - 03/20 0700   P.O. 300    I.V. (mL/kg) 3565 (46.9)    NG/GT     IV Piggyback 1350    Total Intake(mL/kg) 5215 (68.6)    Urine (mL/kg/hr) 1300 (0.7)    Stool 250 (0.1)    Total Output 1550     Net +3665            PHYSICAL EXAMINATION: General:  Frail, thin, chronically ill appearing male, more awake Neuro:  Generalized weakness. Oriented X 2 HEENT:  Mm dry, pale, no JVD Cardiovascular:  s1s2 rrr, sternotomy scar  Lungs: CTA Abdomen:  Soft, nt, +bs, no r/g Musculoskeletal:  Pale, multiple small pressure ulcers,  + edema bilaterally  LABS:  PULMONARY   CBC  Recent Labs Lab 03/07/14 0519 03/08/14 0405 03/10/14 0835  HGB 13.3 12.8* 11.1*  HCT 40.2 38.9* 33.3*  WBC 6.2 14.5* 9.1  PLT 44* 48* 75*   CARDIAC    Recent Labs Lab 03/04/14 0500 03/08/14  1800 03/09/14 0900 03/09/14 1525 03/09/14 2100  TROPONINI >20.00* 1.04* 0.80* 0.79* 0.74*    Recent Labs Lab 03/05/14 0445 03/08/14 0623  PROBNP 6895.0* 7032.0*   CHEMISTRY  Recent Labs Lab 03/05/14 0445 03/06/14 0330 03/07/14 0519 03/08/14 0405 03/08/14 1800 03/09/14 1525 03/10/14 0835  NA 157* 148* 143 139 142 140 139  K 3.0* 2.8* 3.2* 3.7 2.6* 3.4* 3.2*  CL 120* 111 104 98 104 104 103  CO2 27 28 28 29 28 25 23   GLUCOSE 145* 203* 185* 243* 201* 105* 134*  BUN 48* 31* 18 10 10 10 12   CREATININE 1.06 0.95 0.72 0.72 0.68 0.63 0.57  CALCIUM 7.7* 7.5* 7.7* 7.6* 6.5* 7.2* 7.0*  MG 1.7 1.9 1.8 1.6  --  1.4*  --   PHOS 0.8* 1.1* 1.5* 3.8  --  1.4*  --    Estimated Creatinine Clearance: 71.2  ml/min (by C-G formula based on Cr of 0.57).  INFECTIOUS  Recent Labs Lab 03/03/14 1629 03/04/14 0500 03/05/14 0445 03/05/14 0446 03/06/14 0330 03/08/14 1742  LATICACIDVEN 1.8  --  1.6  --   --  1.3  PROCALCITON  --  1.09  --  0.88 0.26  --    ENDOCRINE CBG (last 3)   Recent Labs  03/10/14 0409 03/10/14 0835 03/10/14 1147  GLUCAP 126* 113* 178*   IMAGING x48h  Dg Chest Port 1 View  03/10/2014   CLINICAL DATA:  Acute respiratory failure.  EXAM: PORTABLE CHEST - 1 VIEW  COMPARISON:  03/04/2014  FINDINGS: Central venous catheter is probably in a persistent left sided superior vena cava, unchanged. Heart size and pulmonary vascularity are normal. There are new tiny bilateral pleural effusions and minimal bibasilar atelectasis.  No acute osseous abnormality.  IMPRESSION: New minimal bibasilar atelectasis and tiny fusions.   Electronically Signed   By: Geanie Cooley M.D.   On: 03/10/2014 08:12   Dg Abd Portable 1v  03/09/2014   CLINICAL DATA:  Feeding tube placement.  EXAM: PORTABLE ABDOMEN - 1 VIEW  COMPARISON:  Single view of the abdomen 03/05/2014.  FINDINGS: Feeding tube is in place with the tip in the stomach.  IMPRESSION: As above.   Electronically Signed   By: Drusilla Kanner M.D.   On: 03/09/2014 00:18    ASSESSMENT / PLAN:  Severe Sepsis (recurrent & now d/t [facility] 3/17) Cdiff colitis Hx HTN, CAD, MI, s/p CABG  - NSTEMI d/t demand ischemia.  - S/p IV heparin. Off pressors    Hypokalemia  DM  Acute renal failure (resolved, but at high risk for recurrent injury w/ hypotension) Thrombocytopenia: improving Chronic cervical stenosis   P:  Supplemental oxygen Goal SpO2>92 Hold BB  IV flagyl  Replete K, Phos now Oral vanc500 q6h Levoquin limit duration to 8 days Advance diet as tolerated Follow Platelets on CBC Follow CBG and re-initiate basal if consistently high now that diet has started.  SSI   TRH to assume care as of 3/20 and PCCM to sign off  Joneen Roach,  ACNP [place] [facility] Pager [phone] or [phone]   PCCM ATTENDING: I have interviewed and examined the patient and reviewed the database. I have formulated the assessment and plan as reflected in the note above with amendments made by me.   Billy Fischer, MD;  PCCM service; Mobile [phone]

## 2014-03-10 NOTE — Progress Notes (Signed)
Subjective: Alert and oriented to place.    Objective: Vital signs in last 24 hours: Temp:  [97.7 F (36.5 C)-99.6 F (37.6 C)] 98.7 F (37.1 C) (03/19 0343) Pulse Rate:  [79-103] 94 (03/19 0343) Resp:  [17-24] 20 (03/19 0343) BP: (82-129)/(47-76) 129/49 mmHg (03/19 0343) SpO2:  [92 %-98 %] 98 % (03/19 0343) Weight:  [167 lb 8.8 oz (76 kg)-167 lb 12.3 oz (76.1 kg)] 167 lb 8.8 oz (76 kg) (03/19 0555) Last BM Date: 03/09/14  Intake/Output from previous day: 03/18 0701 - 03/19 0700 In: 5215 [P.O.:300; I.V.:3565; IV Piggyback:1350] Out: 1550 [Urine:1300; Stool:250] Intake/Output this shift:    Medications Current Facility-Administered Medications  Medication Dose Route Frequency Provider Last Rate Last Dose  . 0.9 %  sodium chloride infusion   Intravenous Continuous Oretha Milchakesh V Alva, MD 150 mL/hr at 03/10/14 0350    . albuterol (PROVENTIL) (2.5 MG/3ML) 0.083% nebulizer solution 2.5 mg  2.5 mg Nebulization Q2H PRN Bernadene PersonKathryn A Whiteheart, NP      . antiseptic oral rinse (BIOTENE) solution 15 mL  15 mL Mouth Rinse q12n4p Konstantin Zubelevitskiy, MD   15 mL at 03/09/14 1600  . chlorhexidine (PERIDEX) 0.12 % solution 15 mL  15 mL Mouth Rinse BID Lonia FarberKonstantin Zubelevitskiy, MD   15 mL at 03/09/14 2105  . insulin aspart (novoLOG) injection 0-15 Units  0-15 Units Subcutaneous 6 times per day Simonne MartinetPeter E Babcock, NP   2 Units at 03/09/14 [id number]  . levofloxacin (LEVAQUIN) IVPB 750 mg  750 mg Intravenous Q24H Nelda Bucksaniel J Feinstein, MD   750 mg at 03/09/14 1519  . metroNIDAZOLE (FLAGYL) IVPB 500 mg  500 mg Intravenous Q8H Calvert CantorSaima Rizwan, MD   500 mg at 03/10/14 0112  . norepinephrine (LEVOPHED) 4 mg in dextrose 5 % 250 mL infusion  5-50 mcg/min Intravenous Titrated Oretha Milchakesh V Alva, MD      . sodium chloride 0.9 % bolus 1,000 mL  1,000 mL Intravenous PRN Calvert CantorSaima Rizwan, MD   1,000 mL at 03/08/14 1750  . sodium chloride 0.9 % bolus 1,000 mL  1,000 mL Intravenous PRN Oretha Milchakesh V Alva, MD      . sodium chloride 0.9 %  injection 10-40 mL  10-40 mL Intracatheter PRN Lonia FarberKonstantin Zubelevitskiy, MD   10 mL at 03/10/14 0835  . vancomycin (VANCOCIN) 50 mg/mL oral solution 500 mg  500 mg Per Tube 4 times per day Simonne MartinetPeter E Babcock, NP   500 mg at 03/10/14 0553    PE: General appearance: alert, cooperative and no distress Neck: no JVD Lungs: Anterior BS decreased on the right.  No wheezing. Heart: regular rate and rhythm, S1, S2 normal, no murmur, click, rub or gallop Abdomen: +BS, soft nontender. Extremities: 2+ LEE Pulses: 2+ and symmetric Skin: Warm and dry Neurologic: Grossly normal.  Very weak.  Lab Results:   Recent Labs  03/08/14 0405 03/10/14 0835  WBC 14.5* 9.1  HGB 12.8* 11.1*  HCT 38.9* 33.3*  PLT 48* 75*   BMET  Recent Labs  03/08/14 0405 03/08/14 1800 03/09/14 1525  NA 139 142 140  K 3.7 2.6* 3.4*  CL 98 104 104  CO2 29 28 25   GLUCOSE 243* 201* 105*  BUN 10 10 10   CREATININE 0.72 0.68 0.63  CALCIUM 7.6* 6.5* 7.2*    Studies/Results: Cardiac Cath  Final Conclusions:  1. Severe native 3 vessel CAD with total occlusion of the LM and RCA  2. Patency of the free RIMA-LAD, LIMA-diagonal, and SVG-PDA  3. Total  occlusion of the SVG-LCx (appears chronic)  4. Low LVEDP   Assessment/Plan   Principal Problem:   Sepsis Active Problems:   Acute renal failure   Acute respiratory failure   DKA (diabetic ketoacidoses)   Metabolic acidosis   Encephalopathy acute   Shock circulatory   CAD (coronary artery disease), native coronary artery   NSTEMI (non-ST elevated myocardial infarction)   Protein-calorie malnutrition, severe  Plan:   Nstemi: considered demand ischemia Troponin trending down.  Near normal.  SP cath revealing severe native 3 vessel CAD with total occlusion of the LM and RCA.  Patency of the free RIMA-LAD, LIMA-diagonal, and SVG-PDA.  Total occlusion of the SVG-LCx which appeared chronic.  Currently not on any cardiac meds.  With hypotension yesterday, I would hold  off.    Levaquin, flagyl, vanc.  Will need SNF.   Needs further K replacement.  Not volume overloaded.  EF 60% Severe hypokinesis base inferior segment and base inferolateral segment.      LOS: 7 days    HAGER, BRYAN 03/10/2014 9:09 AM  Patient seen.  Agree with above assessment.  He is in normal sinus rhythm and mild sinus tachycardia.  Agree that in view of his previous problems with abrupt hypotension I would not restart beta blocker at this time.  He is tolerating his rhythm without difficulty.  He is presently eating lunch and is asking for solid food.  I told him he would need to be cleared for this by his medical doctor.  He denies any difficulty with swallowing at the present time.

## 2014-03-10 NOTE — Evaluation (Signed)
Clinical/Bedside Swallow Evaluation Patient Details  Name: [name] [name] MRN: [id number] Date of Birth: 12/17/1929  Today's Date: 03/10/2014 Time: 1025-1047 SLP Time Calculation (min): 22 min  Past Medical History:  Past Medical History  Diagnosis Date  . DM (diabetes mellitus)   . HTN (hypertension)   . CAD (coronary artery disease)   . MI (myocardial infarction)   . Dementia   . Arthritis   . Anxiety   . Hyperlipidemia    Past Surgical History:  Past Surgical History  Procedure Laterality Date  . Coronary artery bypass graft    . Carotid endarterectomy     HPI:  80 [facility].o. [facility] with hx of HTN, DM, CAD, MI.  Presented 3/12 after being found unresponsive. Pt last seen ~5 days prior to admit by family at which time he was reportedly confused, not himself. Neighbors called EMS after seeing multiple newspapers outside. Unresponsive, hypotensive, hypothermic on EMS arrival, found in urine and fesces with multiple pressure ulcers and contusion on left side of his head. ?DKA. BP 80's in ER despite 3L fluids, hypothermic with temp 91. The pt was dx with septic shock, acute renal failure, DKA, CAD/NSTEMI with h/o CABG. Cath 3/13 showed CAD.    Assessment / Plan / Recommendation Clinical Impression  Patient presents with a functional oropharyngeal swallow without evidence of aspiration or dysphagia. No S/lp f/u indicated at this time. Reviewed precautions with RN.    Aspiration Risk  Mild    Diet Recommendation Regular;Thin liquid   Liquid Administration via: Cup;Straw Medication Administration: Whole meds with liquid Supervision: Patient able to self feed Compensations: Slow rate;Small sips/bites Postural Changes and/or Swallow Maneuvers: Seated upright 90 degrees    Other  Recommendations Oral Care Recommendations: Oral care BID   Follow Up Recommendations  None (for dysphagia)    Frequency and Duration        Pertinent Vitals/Pain n/a        Swallow  Study    General HPI: 80 [facility]. [facility]x of HTN, DM, CAD, MI.  Presented 3/12 after being found unresponsive. Pt last seen ~5 days prior to admit by family at which time he was reportedly confused, not himself. Neighbors called EMS after seeing multiple newspapers outside. Unresponsive, hypotensive, hypothermic on EMS arrival, found in urine and fesces with multiple pressure ulcers and contusion on left side of his head. ?DKA. BP 80's in ER despite 3L fluids, hypothermic with temp 91. The pt was dx with septic shock, acute renal failure, DKA, CAD/NSTEMI with h/o CABG. Cath 3/13 showed CAD.  Type of Study: Bedside swallow evaluation Previous Swallow Assessment: none Diet Prior to this Study: Thin liquids (full liquids) Temperature Spikes Noted: No Respiratory Status: Room air History of Recent Intubation: No Behavior/Cognition: Alert;Cooperative;Pleasant mood;Confused Oral Cavity - Dentition: Dentures, top;Dentures, bottom Self-Feeding Abilities: Able to feed self Patient Positioning: Upright in bed Baseline Vocal Quality: Clear Volitional Cough: Strong Volitional Swallow: Able to elicit    Oral/Motor/Sensory Function Overall Oral Motor/Sensory Function: Appears within functional limits for tasks assessed   Ice Chips Ice chips: Not tested   Thin Liquid Thin Liquid: Within functional limits Presentation: Cup;Self Fed;Straw    Nectar Thick Nectar Thick Liquid: Not tested   Honey Thick Honey Thick Liquid: Not tested   Puree Puree: Within functional limits Presentation: Self Fed;Spoon   Solid   GO   [name] Seres MA, CCC-SLP [phone]  Solid: Within functional limits Presentation: Self Fed  [name] [name] 03/10/2014,10:53 AM

## 2014-03-10 NOTE — Progress Notes (Signed)
Physical Therapy Treatment Patient Details Name: [name] [name] MRN: [id number] DOB: 03-18-1929 Today's Date: 03/10/2014 Time: 4098-1191 PT Time Calculation (min): 17 min  PT Assessment / Plan / Recommendation  History of Present Illness 80 y.o. male admitted to [facility] on 03/03/14 with 80 yo male with hx HTN, DM, CAD, MI presented 3/12 after being found unresponsive.  Pt last seen ~5 days prior to admit by family at which time he was reportedly confused, not himself.  Neighbors called EMS d/t multiple newspapers outside.  Unresponsive, hypotensive, hypothermic on EMS arrival, found in urine and fesces with multiple pressure ulcers.  ?DKA. BP 80's in ER despite 3L fluids, hypothermic with temp 91.   The pt was dx with septic shock, acute renal failure, DKA, CAD/NSTEMI with h/o CABG.  Cath 3/13 showed severe native 3 vessel CAD with total occlusion of the LM and RCA , patency of the free RIMA-LAD, LIMA-diagonal, and SVG-PDA, total occlusion of the SVG-LCx (appears chronic) and low LVEDP most consistent with septic/vasodiliatory shock.  He also has a contusion on the left side of his forhead.     PT Comments   Pt is progressing slowly with his mobility.  He is extremely weak and deconditioned and still has some pretty significant cognitive deficits.  He is making progress, though as he is more alert today, more oriented than he was and is helping with his mobility and helping to hold himself upright in sitting.  He is still most appropriate for SNF placement at d/c.    Follow Up Recommendations  SNF     Does the patient have the potential to tolerate intense rehabilitation    NA  Barriers to Discharge   None      Equipment Recommendations  Rolling walker with 5" wheels;Wheelchair (measurements PT);Wheelchair cushion (measurements PT);Hospital bed (hoyer lift)    Recommendations for Other Services   None  Frequency Min 2X/week   Progress towards PT Goals Progress towards PT goals: Progressing toward  goals  Plan Current plan remains appropriate    Precautions / Restrictions Precautions Precautions: Fall Precaution Comments: very weak and debilitated   Pertinent Vitals/Pain See vitals flow sheet.     Mobility  Bed Mobility Overal bed mobility: Needs Assistance Bed Mobility: Supine to Sit;Sit to Supine Supine to sit: +2 for physical assistance;HOB elevated;Max assist Sit to supine: +2 for physical assistance;Max assist General bed mobility comments: Assist needed of both legs and trunk.  Pt attempting to help with arms, but they are weak. HOB raised and pt pulling on bed rail to assist with trunk.      Exercises General Exercises - Lower Extremity Ankle Circles/Pumps: AROM;Both;5 reps;Seated Long Arc Quad: AROM;Both;5 reps;Seated      PT Goals (current goals can now be found in the care plan section) Acute Rehab PT Goals Patient Stated Goal: to get a hamburger and has browns (2).    Visit Information  Last PT Received On: 03/10/14 Assistance Needed: +2 History of Present Illness: 80 y.o. male admitted to [facility] on 03/03/14 with 80 yo male with hx HTN, DM, CAD, MI presented 3/12 after being found unresponsive.  Pt last seen ~5 days prior to admit by family at which time he was reportedly confused, not himself.  Neighbors called EMS d/t multiple newspapers outside.  Unresponsive, hypotensive, hypothermic on EMS arrival, found in urine and fesces with multiple pressure ulcers.  ?DKA. BP 80's in ER despite 3L fluids, hypothermic with temp 91.   The pt was dx with  septic shock, acute renal failure, DKA, CAD/NSTEMI with h/o CABG.  Cath 3/13 showed severe native 3 vessel CAD with total occlusion of the LM and RCA , patency of the free RIMA-LAD, LIMA-diagonal, and SVG-PDA, total occlusion of the SVG-LCx (appears chronic) and low LVEDP most consistent with septic/vasodiliatory shock.  He also has a contusion on the left side of his forhead.      Subjective Data  Subjective: Pt is more alert  and oriented today, but still shows signs of confusion and confabulation.  He is perseverating on getting a hamburger and hashbrowns.   Patient Stated Goal: to get a hamburger and has browns (2).     Cognition  Cognition Arousal/Alertness: Awake/alert Behavior During Therapy: WFL for tasks assessed/performed Overall Cognitive Status: Impaired/Different from baseline Area of Impairment: Attention;Memory;Following commands;Safety/judgement;Awareness;Problem solving Orientation Level: Time Current Attention Level: Sustained Memory: Decreased short-term memory Following Commands: Follows one step commands with increased time Safety/Judgement: Decreased awareness of safety;Decreased awareness of deficits Awareness: Emergent Problem Solving: Slow processing;Decreased initiation;Requires verbal cues;Requires tactile cues General Comments: Pt is much more aware today than last session.  He continues to have some language of confusion at times and he is perseverating on a hamburger and hash browns (although it is close to lunch time).      Balance  Balance Overall balance assessment: Needs assistance Sitting-balance support: Bilateral upper extremity supported;Feet supported Sitting balance-Leahy Scale: Poor Sitting balance - Comments: mod assist to maintain static sitting balance with both arms supported.  Pt leaning to the right and posteriorly.  Pt with poor activity tolerance asking to lay back down.  Sat EOB x 10 min working on sitting balance, midline posture and just tolerance of being upright.  He did not feel he had the strength to stand.    End of Session PT - End of Session Activity Tolerance: Patient limited by fatigue Patient left: in bed;with call bell/phone within reach;with bed alarm set     Antrone Walla B. Ranie Chinchilla, PT, DPT [phone]   03/10/2014, 2:31 PM

## 2014-03-11 LAB — BASIC METABOLIC PANEL
BUN: 9 mg/dL (ref 6–23)
CO2: 25 mEq/L (ref 19–32)
Calcium: 6.8 mg/dL — ABNORMAL LOW (ref 8.4–10.5)
Chloride: 106 mEq/L (ref 96–112)
Creatinine, Ser: 0.59 mg/dL (ref 0.50–1.35)
Glucose, Bld: 187 mg/dL — ABNORMAL HIGH (ref 70–99)
POTASSIUM: 3.1 meq/L — AB (ref 3.7–5.3)
SODIUM: 141 meq/L (ref 137–147)

## 2014-03-11 LAB — CBC
HEMATOCRIT: 32.5 % — AB (ref 39.0–52.0)
Hemoglobin: 10.9 g/dL — ABNORMAL LOW (ref 13.0–17.0)
MCH: 29.9 pg (ref 26.0–34.0)
MCHC: 33.5 g/dL (ref 30.0–36.0)
MCV: 89.3 fL (ref 78.0–100.0)
PLATELETS: 81 10*3/uL — AB (ref 150–400)
RBC: 3.64 MIL/uL — ABNORMAL LOW (ref 4.22–5.81)
RDW: 14.2 % (ref 11.5–15.5)
WBC: 8.7 10*3/uL (ref 4.0–10.5)

## 2014-03-11 LAB — GLUCOSE, CAPILLARY
GLUCOSE-CAPILLARY: 108 mg/dL — AB (ref 70–99)
GLUCOSE-CAPILLARY: 148 mg/dL — AB (ref 70–99)
GLUCOSE-CAPILLARY: 276 mg/dL — AB (ref 70–99)
Glucose-Capillary: 165 mg/dL — ABNORMAL HIGH (ref 70–99)
Glucose-Capillary: 121 mg/dL — ABNORMAL HIGH (ref 70–99)
Glucose-Capillary: 149 mg/dL — ABNORMAL HIGH (ref 70–99)

## 2014-03-11 MED ORDER — LIDOCAINE 5 % EX PTCH
1.0000 | MEDICATED_PATCH | Freq: Every day | CUTANEOUS | Status: DC | PRN
  Administered 2014-03-11: 1 via TRANSDERMAL
  Filled 2014-03-11: qty 1

## 2014-03-11 MED ORDER — POTASSIUM CHLORIDE CRYS ER 20 MEQ PO TBCR
40.0000 meq | EXTENDED_RELEASE_TABLET | Freq: Once | ORAL | Status: AC
  Administered 2014-03-11: 40 meq via ORAL
  Filled 2014-03-11: qty 2

## 2014-03-11 MED ORDER — ACETAMINOPHEN 325 MG PO TABS
650.0000 mg | ORAL_TABLET | Freq: Four times a day (QID) | ORAL | Status: DC | PRN
  Administered 2014-03-11 (×2): 650 mg via ORAL
  Filled 2014-03-11 (×2): qty 2

## 2014-03-11 MED ORDER — METOPROLOL TARTRATE 25 MG/10 ML ORAL SUSPENSION
6.2500 mg | Freq: Two times a day (BID) | ORAL | Status: DC
  Administered 2014-03-11 (×2): 6.25 mg via ORAL
  Filled 2014-03-11 (×5): qty 2.5

## 2014-03-11 MED ORDER — LEVOFLOXACIN 750 MG PO TABS
750.0000 mg | ORAL_TABLET | Freq: Every day | ORAL | Status: DC
  Administered 2014-03-11 – 2014-03-15 (×5): 750 mg via ORAL
  Filled 2014-03-11 (×5): qty 1

## 2014-03-11 NOTE — Progress Notes (Signed)
[facility] PROGRESS NOTE  [name] GustRobert Minerva UVO:[id number]:[id number] DOB: 10/07/1929 DOA: 03/03/2014 PCP: No primary provider on file.  Assessment/Plan:  1-Severe S[facility]is ([facility] & [facility]  In setting C diff colitis and enterobacter Bacteremia.  BP stable. Off pressors.  Continue with IV fluids and antibiotics.   2-Cdiff colitis  Continue with Flagyl and oral vancomycin day day 4.  Suspect recovery from C diff will be slow due to patient need to be on antibiotics.   3-Enterobacter acteremia;  Sensitive to cipro/levaquin.  Day 4/8 antibiotics.  Will probably repeat blood culture in 2 to 4 days.    4-NSTEMI d/t demand ischemia.  - S/p IV heparin. Off pressors  -Cardiology following.  -EF 60 %   Hypokalemia Kcl 40 meq times 2 doses. Check phosphorus level.  DM  Acute renal failure (resolved, but at high risk for recurrent injury w/ hypotension)  Thrombocytopenia: improving  Chronic cervical stenosis  Hx HTN, CAD, MI, s/p CABG   Code Status: Full code.  Family Communication: none at bedside,. Disposition Plan; Remain  inpatient.    Consultants:  Cardiology  Procedures: L IJ CVL 3/12 >>>  Foley 3/12 >>>  ECHO; Severe hypokinesis base inferior segment and base inferolateral segment. Other segments are vigorous. EF maintained at 60%. The cavity size was normal. Wall thickness was normal.    Culture;  3/12 Blood >>> enterobacter  3/12 Urine >>> neg  3/12 MRSA PCR - negative  3/17: c diff: positive   Antibiotics: Vancomycin 3/12>>>3/15  Zosyn 3/12 >>>3/17  levaquin 3/17>>>  Flagyl 3/17>>>  Oral vanc 3/17>>>   HPI/Subjective: Alert, eating breakfast.  Wants to go home.   Objective: Filed Vitals:   03/11/14 1102  BP: 107/64  Pulse: 108  Temp:   Resp:     Intake/Output Summary (Last 24 hours) at 03/11/14 1107 Last data filed at 03/11/14 0414  Gross per 24 hour  Intake   2485 ml  Output   1450 ml  Net   1035 ml   Filed Weights   03/10/14  0343 03/10/14 0555 03/11/14 0621  Weight: 76.1 kg (167 lb 12.3 oz) 76 kg (167 lb 8.8 oz) 77.1 kg (169 lb 15.6 oz)    Exam:   General:  Alert, in no distress.   Cardiovascular: S 1, S 2 RRR  Respiratory: CTA  Abdomen: Bs present, soft, mild distended. `  Musculoskeletal: trace edema.   Data Reviewed: Basic Metabolic Panel:  Recent Labs Lab 03/05/14 0445 03/06/14 0330 03/07/14 0519 03/08/14 0405 03/08/14 1800 03/09/14 1525 03/10/14 0835 03/11/14 0405  NA 157* 148* 143 139 142 140 139 141  K 3.0* 2.8* 3.2* 3.7 2.6* 3.4* 3.2* 3.1*  CL 120* 111 104 98 104 104 103 106  CO2 27 28 28 29 28 25 23 25   GLUCOSE 145* 203* 185* 243* 201* 105* 134* 187*  BUN 48* 31* 18 10 10 10 12 9   CREATININE 1.06 0.95 0.72 0.72 0.68 0.63 0.57 0.59  CALCIUM 7.7* 7.5* 7.7* 7.6* 6.5* 7.2* 7.0* 6.8*  MG 1.7 1.9 1.8 1.6  --  1.4*  --   --   PHOS 0.8* 1.1* 1.5* 3.8  --  1.4*  --   --    Liver Function Tests:  Recent Labs Lab 03/05/14 0445 03/10/14 0835  AST 42* 21  ALT 22 16  ALKPHOS 65 128*  BILITOT 0.4 0.4  PROT 4.1* 4.2*  ALBUMIN 1.8* 1.3*   No results found for this basename: LIPASE, AMYLASE,  in the last 168 hours No results found for this basename: AMMONIA,  in the last 168 hours CBC:  Recent Labs Lab 03/05/14 0445 03/06/14 0330 03/07/14 0519 03/08/14 0405 03/10/14 0835 03/11/14 0405  WBC 10.9* 6.9 6.2 14.5* 9.1 8.7  NEUTROABS 9.4* 6.1 5.6 13.2* 8.5*  --   HGB 12.4* 11.7* 13.3 12.8* 11.1* 10.9*  HCT 37.7* 35.3* 40.2 38.9* 33.3* 32.5*  MCV 89.3 89.1 89.3 89.6 89.8 89.3  PLT 78* 52* 44* 48* 75* 81*   Cardiac Enzymes:  Recent Labs Lab 03/06/14 0330 03/08/14 1800 03/09/14 0900 03/09/14 1525 03/09/14 2100  CKTOTAL 214  --   --   --   --   CKMB 5.8*  --   --   --   --   TROPONINI  --  1.04* 0.80* 0.79* 0.74*   BNP (last 3 results)  Recent Labs  03/05/14 0445 03/08/14 0623  PROBNP 6895.0* 7032.0*   CBG:  Recent Labs Lab 03/10/14 1543 03/10/14 2014  03/11/14 0006 03/11/14 0412 03/11/14 0844  GLUCAP 241* 288* 276* 165* 148*    Recent Results (from the past 240 hour(s))  URINE CULTURE     Status: None   Collection Time    03/03/14  5:21 PM      Result Value Ref Range Status   Specimen Description URINE, CATHETERIZED   Final   Special Requests NONE   Final   Culture  Setup Time     Final   Value: 03/03/2014 18:57     Performed at [facility] Count     Final   Value: NO GROWTH     Performed at [facility]   Culture     Final   Value: NO GROWTH     Performed at [facility]   Report Status 03/04/2014 FINAL   Final  CULTURE, BLOOD (ROUTINE X 2)     Status: None   Collection Time    03/03/14  6:40 PM      Result Value Ref Range Status   Specimen Description BLOOD HAND RIGHT   Final   Special Requests BOTTLES DRAWN AEROBIC AND ANAEROBIC 5CCBLUE 4CCRED   Final   Culture  Setup Time     Final   Value: 03/03/2014 21:58     Performed at [facility]   Culture     Final   Value: NO GROWTH 5 DAYS     Performed at [facility]   Report Status 03/09/2014 FINAL   Final  CULTURE, BLOOD (ROUTINE X 2)     Status: None   Collection Time    03/03/14  6:52 PM      Result Value Ref Range Status   Specimen Description BLOOD HAND LEFT   Final   Special Requests BOTTLES DRAWN AEROBIC AND ANAEROBIC 5CC   Final   Culture  Setup Time     Final   Value: 03/03/2014 22:01     Performed at [facility]   Culture     Final   Value: ENTEROBACTER AEROGENES     Note: Gram Stain Report Called to,Read Back By and Verified With: PORTER RN AT [phone] 03/05/14 BY SNOLO     Performed at [facility]   Report Status 03/07/2014 FINAL   Final   Organism ID, Bacteria ENTEROBACTER AEROGENES   Final  MRSA PCR SCREENING     Status: None   Collection Time    03/03/14 10:04 PM  Result Value Ref Range Status   MRSA by PCR NEGATIVE  NEGATIVE Final   Comment:            The GeneXpert MRSA  Assay (FDA     approved for NASAL specimens     only), is one component of a     comprehensive MRSA colonization     surveillance program. It is not     intended to diagnose MRSA     infection nor to guide or     monitor treatment for     MRSA infections.  CLOSTRIDIUM DIFFICILE BY PCR     Status: Abnormal   Collection Time    03/08/14  2:32 PM      Result Value Ref Range Status   C difficile by pcr POSITIVE (*) NEGATIVE Final   Comment: CRITICAL RESULT CALLED TO, READ BACK BY AND VERIFIED WITH:     GLOSSON RN 15:45 03/08/14 ([email or web address])     Studies: Dg Chest Port 1 View  03/10/2014   CLINICAL DATA:  Acute respiratory failure.  EXAM: PORTABLE CHEST - 1 VIEW  COMPARISON:  03/04/2014  FINDINGS: Central venous catheter is probably in a persistent left sided superior vena cava, unchanged. Heart size and pulmonary vascularity are normal. There are new tiny bilateral pleural effusions and minimal bibasilar atelectasis.  No acute osseous abnormality.  IMPRESSION: New minimal bibasilar atelectasis and tiny fusions.   Electronically Signed   By: Geanie Cooley M.D.   On: 03/10/2014 08:12    Scheduled Meds: . antiseptic oral rinse  15 mL Mouth Rinse q12n4p  . chlorhexidine  15 mL Mouth Rinse BID  . insulin aspart  0-15 Units Subcutaneous 6 times per day  . levofloxacin  750 mg Oral Daily  . metoprolol tartrate  6.25 mg Oral BID  . metronidazole  500 mg Intravenous Q8H  . vancomycin  500 mg Per Tube 4 times per day   Continuous Infusions: . sodium chloride 150 mL/hr at 03/10/14 2133    Principal Problem:   Sepsis Active Problems:   Acute renal failure   Acute respiratory failure   DKA (diabetic ketoacidoses)   Metabolic acidosis   Encephalopathy acute   Shock circulatory   CAD- prior CABG 3 times, cath 03/04/14- medical Rx   NSTEMI - type 2   Protein-calorie malnutrition, severe   C. difficile colitis    Time spent: 35 minutes.     Hartley Barefoot A  [facility] Pager  [phone]. If 7PM-7AM, please contact night-coverage at [email or web address], password [facility] 03/11/2014, 11:07 AM  LOS: 8 days

## 2014-03-11 NOTE — Progress Notes (Signed)
    Subjective:  Awake and alert, still weak.  Objective:  Vital Signs in the last 24 hours: Temp:  [97.9 F (36.6 C)-98.5 F (36.9 C)] 98.5 F (36.9 C) (03/20 0412) Pulse Rate:  [112-117] 117 (03/20 0412) Resp:  [20] 20 (03/20 0412) BP: (88-159)/(57-74) 127/74 mmHg (03/20 0412) SpO2:  [95 %-99 %] 97 % (03/20 0412) Weight:  [169 lb 15.6 oz (77.1 kg)] 169 lb 15.6 oz (77.1 kg) (03/20 0621)  Intake/Output from previous day:  Intake/Output Summary (Last 24 hours) at 03/11/14 0810 Last data filed at 03/11/14 0414  Gross per 24 hour  Intake   2585 ml  Output   1450 ml  Net   1135 ml    Physical Exam: General appearance: alert, cooperative, no distress, pale and slowed mentation Lungs: deceased Rt base Heart: regular rate and rhythm and extra systole   Rate: 90  Rhythm: normal sinus rhythm and premature ventricular contractions (PVC)  Lab Results:  Recent Labs  03/10/14 0835 03/11/14 0405  WBC 9.1 8.7  HGB 11.1* 10.9*  PLT 75* 81*    Recent Labs  03/10/14 0835 03/11/14 0405  NA 139 141  K 3.2* 3.1*  CL 103 106  CO2 23 25  GLUCOSE 134* 187*  BUN 12 9  CREATININE 0.57 0.59    Recent Labs  03/09/14 1525 03/09/14 2100  TROPONINI 0.79* 0.74*   No results found for this basename: INR,  in the last 72 hours  Imaging: Imaging results have been reviewed  Cardiac Studies:  Assessment/Plan:   Principal Problem:   Sepsis Active Problems:   NSTEMI - type 2   CAD- prior CABG 3 times, cath 03/04/14- medical Rx   C. difficile colitis   Acute renal failure   Acute respiratory failure   DKA (diabetic ketoacidoses)   Metabolic acidosis   Encephalopathy acute   Shock circulatory   Protein-calorie malnutrition, severe    PLAN: 80 y/o found down 03/03/14 with shock, respiratory failure, acute renal injury,  DKA, and Type 2 NSTEMI  -all improved.  Cath 03/04/14 - medical Rx, EF 60 % by echo.   MD to see. Replace K+ this am. Consider resuming some cardiac meds  starting with beta blocker.   Corine ShelterLuke Kilroy PA-C Bee[facility] [phone] 03/11/2014, 8:10 AM  Agree with above assessment and plan.  This morning the patient is alert and eating breakfast.  Denies chest pain or shortness of breath.  Telemetry shows sinus tachycardia and frequent PACs.  We will resume very small dose of beta blocker and observe response.  In the past the patient has been very sensitive to blood pressure meds.

## 2014-03-11 NOTE — Progress Notes (Signed)
Family (Sister & Niece) would like MD to consider Palliative Care and address code status (Limited code /DNR?) during this admission.  Dr. Sunnie Nielsenegalado notified and will speak to family and pt tomorrow.

## 2014-03-11 NOTE — Clinical Social Work Note (Signed)
40M CSW provided handoff to 3W CSW.    Vickii PennaGina Gregorio Worley, LCSWA [phone]  Clinical Social Work

## 2014-03-11 NOTE — Progress Notes (Signed)
Patient's granddaughter's mother died and she does not want to speak about her uncle until Monday.  Sabino NiemannAmy Lira Stephen, MSW, [employer]CSWA [phone]

## 2014-03-11 NOTE — Progress Notes (Signed)
Inpatient Diabetes Program Recommendations  AACE/ADA: New Consensus Statement on Inpatient Glycemic Control (2013)  Target Ranges:  Prepandial:   less than 140 mg/dL      Peak postprandial:   less than 180 mg/dL (1-2 hours)      Critically ill patients:  140 - 180 mg/dL     Results for [name], [name] [name] (MRN [id number]) as of 03/11/2014 11:46  Ref. Range 03/10/2014 00:17 03/10/2014 04:09 03/10/2014 08:35 03/10/2014 11:47 03/10/2014 15:43 03/10/2014 20:14  Glucose-Capillary Latest Range: 70-99 mg/dL 409122 (H) 811126 (H) 914113 (H) 178 (H) 241 (H) 288 (H)    Results for [name], [name] [name] (MRN [id number]) as of 03/11/2014 11:46  Ref. Range 03/11/2014 00:06 03/11/2014 04:12 03/11/2014 08:44  Glucose-Capillary Latest Range: 70-99 mg/dL 086276 (H) 578165 (H) 469148 (H)     **Per RN, pt eating very well.  Having issues with late afternoon glucose elevations.   **MD- Please consider the following insulin adjustments:  1. Change Novolog Moderate SSi to tid ac + HS (currently ordered Q4 hours) 2. Add Novolog 4 units tid with meals (scheduled meal coverage) 3. Pt will likely need insulin at SNF (A1c 15% this admission)   Will follow. Ambrose FinlandJeannine Johnston Tashika Goodin RN, MSN, CDE Diabetes Coordinator Inpatient Diabetes Program Team Pager: [phone] (8a-10p)

## 2014-03-11 NOTE — Progress Notes (Signed)
Occupational Therapy Treatment Patient Details Name: [name] [name] MRN: [id number] DOB: 03-May-1929 Today's Date: 03/11/2014 Time: 4098-1191 OT Time Calculation (min): 28 min  OT Assessment / Plan / Recommendation  History of present illness 80 y.o. male admitted to [facility] on 03/03/14 with 80 yo male with hx HTN, DM, CAD, MI presented 3/12 after being found unresponsive.  Pt last seen ~5 days prior to admit by family at which time he was reportedly confused, not himself.  Neighbors called EMS d/t multiple newspapers outside.  Unresponsive, hypotensive, hypothermic on EMS arrival, found in urine and fesces with multiple pressure ulcers.  ?DKA. BP 80's in ER despite 3L fluids, hypothermic with temp 91.   The pt was dx with septic shock, acute renal failure, DKA, CAD/NSTEMI with h/o CABG.  Cath 3/13 showed severe native 3 vessel CAD with total occlusion of the LM and RCA , patency of the free RIMA-LAD, LIMA-diagonal, and SVG-PDA, total occlusion of the SVG-LCx (appears chronic) and low LVEDP most consistent with septic/vasodiliatory shock.  He also has a contusion on the left side of his forhead.     OT comments  Making steady progress. Max A with UB ADL and total A with LB ADL. Apparent cognitive deficits.. Cotinue to rec SNF for rehab. Will follow  Follow Up Recommendations  SNF;Supervision/Assistance - 24 hour    Barriers to Discharge       Equipment Recommendations  None recommended by OT    Recommendations for Other Services    Frequency Min 2X/week   Progress towards OT Goals Progress towards OT goals: Progressing toward goals  Plan Discharge plan remains appropriate    Precautions / Restrictions Precautions Precautions: Fall Precaution Comments: very weak and debilitated   Pertinent Vitals/Pain no apparent distress     ADL  Grooming: Moderate assistance;Wash/dry hands;Wash/dry face;Brushing hair Transfers/Ambulation Related to ADLs: +2 total A ADL Comments: Unable to sit unsupported  EOBN    OT Diagnosis:    OT Problem List:   OT Treatment Interventions:     OT Goals(current goals can now be found in the care plan section) Acute Rehab OT Goals Patient Stated Goal: to go home ADL Goals Pt Will Perform Grooming: with min assist;sitting Pt Will Perform Upper Body Bathing: with mod assist;with min assist;sitting Pt Will Perform Lower Body Bathing: with max assist;with mod assist;sitting/lateral leans Pt Will Perform Upper Body Dressing: with mod assist;with min assist;sitting Pt Will Transfer to Toilet: with total assist;with max assist;bedside commode Additional ADL Goal #1: Pt will tolerate B UE strengthening exercises with level 1 theraband  sitting EOB with mod  - min A for balance/support  Additional ADL Goal #2: Pt will complete bed mobility with mod A to sit EOB in prep for ADLs  Visit Information  Last OT Received On: 03/11/14 History of Present Illness: 80 y.o. male admitted to [facility] on 03/03/14 with 80 yo male with hx HTN, DM, CAD, MI presented 3/12 after being found unresponsive.  Pt last seen ~5 days prior to admit by family at which time he was reportedly confused, not himself.  Neighbors called EMS d/t multiple newspapers outside.  Unresponsive, hypotensive, hypothermic on EMS arrival, found in urine and fesces with multiple pressure ulcers.  ?DKA. BP 80's in ER despite 3L fluids, hypothermic with temp 91.   The pt was dx with septic shock, acute renal failure, DKA, CAD/NSTEMI with h/o CABG.  Cath 3/13 showed severe native 3 vessel CAD with total occlusion of the LM and RCA , patency  of the free RIMA-LAD, LIMA-diagonal, and SVG-PDA, total occlusion of the SVG-LCx (appears chronic) and low LVEDP most consistent with septic/vasodiliatory shock.  He also has a contusion on the left side of his forhead.      Subjective Data      Prior Functioning       Cognition  Cognition Arousal/Alertness: Awake/alert Behavior During Therapy: Flat affect Overall Cognitive  Status: Impaired/Different from baseline Area of Impairment: Attention;Memory;Following commands;Safety/judgement;Awareness;Problem solving Orientation Level: Time Current Attention Level: Sustained Memory: Decreased short-term memory Following Commands: Follows one step commands with increased time Safety/Judgement: Decreased awareness of safety;Decreased awareness of deficits Awareness: Emergent Problem Solving: Slow processing;Decreased initiation;Requires verbal cues;Requires tactile cues General Comments: Unable to sit EOB, however, does not understand why he can not go home alone    Mobility  Bed Mobility Overal bed mobility: Needs Assistance Bed Mobility: Sidelying to Sit;Sit to Supine Sidelying to sit: Total assist Supine to sit: Total assist    Exercises  General Exercises - Upper Extremity Shoulder Flexion: AAROM;AROM;Both;10 reps Shoulder Extension: AROM;AAROM;Both;10 reps Elbow Flexion: AAROM;Self ROM;Both;10 reps Elbow Extension: AAROM;AROM;Both;10 reps   Balance    End of Session OT - End of Session Activity Tolerance: Patient limited by fatigue Patient left: in bed;with call bell/phone within reach;with bed alarm set Nurse Communication: Mobility status;Precautions;Need for lift equipment (need to use maximove)  GO     Augusta Mirkin,HILLARY 03/11/2014, 3:21 PM [facility] Jayshaun Phillips, OTR/L  [phone] 03/11/2014 [facility] Boaz Berisha, OTR/L  [phone] 03/11/2014

## 2014-03-12 LAB — CBC
HEMATOCRIT: 34.3 % — AB (ref 39.0–52.0)
HEMOGLOBIN: 11.4 g/dL — AB (ref 13.0–17.0)
MCH: 29.5 pg (ref 26.0–34.0)
MCHC: 33.2 g/dL (ref 30.0–36.0)
MCV: 88.6 fL (ref 78.0–100.0)
Platelets: 82 10*3/uL — ABNORMAL LOW (ref 150–400)
RBC: 3.87 MIL/uL — ABNORMAL LOW (ref 4.22–5.81)
RDW: 14.5 % (ref 11.5–15.5)
WBC: 9.4 10*3/uL (ref 4.0–10.5)

## 2014-03-12 LAB — GLUCOSE, CAPILLARY
GLUCOSE-CAPILLARY: 116 mg/dL — AB (ref 70–99)
Glucose-Capillary: 113 mg/dL — ABNORMAL HIGH (ref 70–99)
Glucose-Capillary: 132 mg/dL — ABNORMAL HIGH (ref 70–99)
Glucose-Capillary: 137 mg/dL — ABNORMAL HIGH (ref 70–99)
Glucose-Capillary: 158 mg/dL — ABNORMAL HIGH (ref 70–99)
Glucose-Capillary: 186 mg/dL — ABNORMAL HIGH (ref 70–99)
Glucose-Capillary: 190 mg/dL — ABNORMAL HIGH (ref 70–99)

## 2014-03-12 LAB — BASIC METABOLIC PANEL
BUN: 8 mg/dL (ref 6–23)
CO2: 25 meq/L (ref 19–32)
Calcium: 6.8 mg/dL — ABNORMAL LOW (ref 8.4–10.5)
Chloride: 107 mEq/L (ref 96–112)
Creatinine, Ser: 0.53 mg/dL (ref 0.50–1.35)
GFR calc Af Amer: >90 mL/min (ref >90)
GFR calc non Af Amer: >90 mL/min (ref >90)
Glucose, Bld: 126 mg/dL — ABNORMAL HIGH (ref 70–99)
Potassium: 3.3 mEq/L — ABNORMAL LOW (ref 3.7–5.3)
SODIUM: 140 meq/L (ref 137–147)

## 2014-03-12 LAB — PHOSPHORUS: Phosphorus: 1.4 mg/dL — ABNORMAL LOW (ref 2.3–4.6)

## 2014-03-12 MED ORDER — POTASSIUM PHOSPHATE DIBASIC 3 MMOLE/ML IV SOLN
30.0000 mmol | Freq: Once | INTRAVENOUS | Status: AC
  Administered 2014-03-12: 30 mmol via INTRAVENOUS
  Filled 2014-03-12: qty 10

## 2014-03-12 MED ORDER — METOPROLOL TARTRATE 25 MG/10 ML ORAL SUSPENSION
12.5000 mg | Freq: Two times a day (BID) | ORAL | Status: DC
  Administered 2014-03-12 – 2014-03-15 (×6): 12.5 mg via ORAL
  Filled 2014-03-12 (×8): qty 5

## 2014-03-12 MED ORDER — POTASSIUM CHLORIDE CRYS ER 20 MEQ PO TBCR
40.0000 meq | EXTENDED_RELEASE_TABLET | Freq: Two times a day (BID) | ORAL | Status: AC
  Administered 2014-03-12 – 2014-03-13 (×2): 40 meq via ORAL
  Filled 2014-03-12 (×3): qty 2

## 2014-03-12 NOTE — Progress Notes (Signed)
Subjective:  C/o weakness of arms and legs.  Not SOB.  No chest pain.  Objective:  Vital Signs in the last 24 hours: BP 123/70  Pulse 89  Temp(Src) 97.9 F (36.6 C) (Oral)  Resp 20  Ht 5\' 9"  (1.753 m)  Wt 81.9 kg (180 lb 8.9 oz)  BMI 26.65 kg/m2  SpO2 97%  Physical Exam: Elderly WM in NAD Lungs:  Clear Cardiac:  Regular rhythm, normal S1 and S2, no S3 Abdomen:  Soft, nontender, no masses Extremities:  No edema present  Intake/Output from previous day: 03/20 0701 - 03/21 0700 In: 2040 [P.O.:240; I.V.:1800] Out: 1450 [Urine:1450]  Weight Filed Weights   03/10/14 0555 03/11/14 0621 03/12/14 0409  Weight: 76 kg (167 lb 8.8 oz) 77.1 kg (169 lb 15.6 oz) 81.9 kg (180 lb 8.9 oz)    Lab Results: Basic Metabolic Panel:  Recent Labs  16/09/9602/20/15 0405 03/12/14 0556  NA 141 140  K 3.1* 3.3*  CL 106 107  CO2 25 25  GLUCOSE 187* 126*  BUN 9 8  CREATININE 0.59 0.53   CBC:  Recent Labs  03/10/14 0835 03/11/14 0405 03/12/14 0556  WBC 9.1 8.7 9.4  NEUTROABS 8.5*  --   --   HGB 11.1* 10.9* 11.4*  HCT 33.3* 32.5* 34.3*  MCV 89.8 89.3 88.6  PLT 75* 81* 82*   Cardiac Enzymes:  Recent Labs  03/09/14 1525 03/09/14 2100  TROPONINI 0.79* 0.74*    Telemetry: Heart rate up.  Sinus with PVC's  Assessment/Plan:  1. Sepsis syndrome 2. Prior CAD with CABG and patent grafts.  Rec:  Continue to reinitiate card meds.  Disposition pending.      Darden PalmerW. Spencer Tanaja Ganger, Jr.  MD [facility] Cardiology  03/12/2014, 9:36 AM

## 2014-03-12 NOTE — Progress Notes (Signed)
[facility] PROGRESS NOTE  [name] [name] ZOX:[id number] DOB: 1929-03-15 DOA: 03/03/2014 PCP: No primary provider on file.  Assessment/Plan:  1-Severe Sepsis (recurrent & now d/t [facility] 3/17)  In setting C diff colitis and enterobacter Bacteremia.  BP stable. Off pressors.  Continue with IV fluids and antibiotics.   2-Cdiff colitis  Continue with Flagyl and oral vancomycin day day 5.  Suspect recovery from C diff will be slow due to patient need to be on antibiotics.   3-Enterobacter acteremia;  Sensitive to cipro/levaquin.  Day 5/8 antibiotics.  Will probably repeat blood culture in 2 days.   4-Back pain; tylenol, lydoycane patch. Avoid narcotics to avoid sedation.   Hypophosphatemia; replace with IV.   -NSTEMI d/t demand ischemia.  - S/p IV heparin. Off pressors  -Cardiology following.  -EF 60 %   Hypokalemia Kcl 40 meq times 2 doses. Replace phosphorus.  DM  Acute renal failure (resolved, but at high risk for recurrent injury w/ hypotension)  Thrombocytopenia: improving  Chronic cervical stenosis  Hx HTN, CAD, MI, s/p CABG   Code Status: Patient daughter died recently. granddaughter unable to make decision at this time. He has a son Trey Paula who the family has not be able to contact. [place] is incline for DNR status. Palliative care consulted, for goals of care meeting.   Family Communication: none at bedside,. Disposition Plan; Remain  inpatient.    Consultants:  Cardiology  Procedures: L IJ CVL 3/12 >>>  Foley 3/12 >>>  ECHO; Severe hypokinesis base inferior segment and base inferolateral segment. Other segments are vigorous. EF maintained at 60%. The cavity size was normal. Wall thickness was normal.    Culture;  3/12 Blood >>> enterobacter  3/12 Urine >>> neg  3/12 MRSA PCR - negative  3/17: c diff: positive   Antibiotics: Vancomycin 3/12>>>3/15  Zosyn 3/12 >>>3/17  levaquin 3/17>>>  Flagyl 3/17>>>  Oral vanc 3/17>>>   HPI/Subjective: Alert,  eating breakfast.  Wants to go home.   Objective: Filed Vitals:   03/12/14 1035  BP: 87/67  Pulse: 105  Temp: 98.1 F (36.7 C)  Resp: 20    Intake/Output Summary (Last 24 hours) at 03/12/14 1621 Last data filed at 03/12/14 0656  Gross per 24 hour  Intake   1800 ml  Output   1450 ml  Net    350 ml   Filed Weights   03/10/14 0555 03/11/14 0621 03/12/14 0409  Weight: 76 kg (167 lb 8.8 oz) 77.1 kg (169 lb 15.6 oz) 81.9 kg (180 lb 8.9 oz)    Exam:   General:  Alert, in no distress.   Cardiovascular: S 1, S 2 RRR  Respiratory: CTA  Abdomen: Bs present, soft, mild distended. `  Musculoskeletal: trace edema.   Data Reviewed: Basic Metabolic Panel:  Recent Labs Lab 03/06/14 0330 03/07/14 0519 03/08/14 0405 03/08/14 1800 03/09/14 1525 03/10/14 0835 03/11/14 0405 03/12/14 0556  NA 148* 143 139 142 140 139 141 140  K 2.8* 3.2* 3.7 2.6* 3.4* 3.2* 3.1* 3.3*  CL 111 104 98 104 104 103 106 107  CO2 28 28 29 28 25 23 25 25   GLUCOSE 203* 185* 243* 201* 105* 134* 187* 126*  BUN 31* 18 10 10 10 12 9 8   CREATININE 0.95 0.72 0.72 0.68 0.63 0.57 0.59 0.53  CALCIUM 7.5* 7.7* 7.6* 6.5* 7.2* 7.0* 6.8* 6.8*  MG 1.9 1.8 1.6  --  1.4*  --   --   --   PHOS 1.1* 1.5* 3.8  --  1.4*  --   --  1.4*   Liver Function Tests:  Recent Labs Lab 03/10/14 0835  AST 21  ALT 16  ALKPHOS 128*  BILITOT 0.4  PROT 4.2*  ALBUMIN 1.3*   No results found for this basename: LIPASE, AMYLASE,  in the last 168 hours No results found for this basename: AMMONIA,  in the last 168 hours CBC:  Recent Labs Lab 03/06/14 0330 03/07/14 0519 03/08/14 0405 03/10/14 0835 03/11/14 0405 03/12/14 0556  WBC 6.9 6.2 14.5* 9.1 8.7 9.4  NEUTROABS 6.1 5.6 13.2* 8.5*  --   --   HGB 11.7* 13.3 12.8* 11.1* 10.9* 11.4*  HCT 35.3* 40.2 38.9* 33.3* 32.5* 34.3*  MCV 89.1 89.3 89.6 89.8 89.3 88.6  PLT 52* 44* 48* 75* 81* 82*   Cardiac Enzymes:  Recent Labs Lab 03/06/14 0330 03/08/14 1800  03/09/14 0900 03/09/14 1525 03/09/14 2100  CKTOTAL 214  --   --   --   --   CKMB 5.8*  --   --   --   --   TROPONINI  --  1.04* 0.80* 0.79* 0.74*   BNP (last 3 results)  Recent Labs  03/05/14 0445 03/08/14 0623  PROBNP 6895.0* 7032.0*   CBG:  Recent Labs Lab 03/12/14 03/12/14 0411 03/12/14 0802 03/12/14 1032 03/12/14 1152  GLUCAP 132* 113* 116* 137* 158*    Recent Results (from the past 240 hour(s))  URINE CULTURE     Status: None   Collection Time    03/03/14  5:21 PM      Result Value Ref Range Status   Specimen Description URINE, CATHETERIZED   Final   Special Requests NONE   Final   Culture  Setup Time     Final   Value: 03/03/2014 18:57     [facility]formed [facility]   Colony Count     Final   Value: NO GROWTH     [facility]formed [facility]   Culture     Final   Value: NO GROWTH     [facility]formed at [facility]   Report Status 03/04/2014 FINAL   Final  CULTURE, BLOOD (ROUTINE X 2)     Status: None   Collection Time    03/03/14  6:40 PM      Result Value Ref Range Status   Specimen Description BLOOD HAND RIGHT   Final   Special Requests BOTTLES DRAWN AEROBIC AND ANAEROBIC 5CCBLUE 4CCRED   Final   Culture  Setup Time     Final   Value: 03/03/2014 21:58     [facility]formed [facility]   Culture     Final   Value: NO GROWTH 5 DAYS     [facility]formed at [facility]   Report Status 03/09/2014 FINAL   Final  CULTURE, BLOOD (ROUTINE X 2)     Status: None   Collection Time    03/03/14  6:52 PM      Result Value Ref Range Status   Specimen Description BLOOD HAND LEFT   Final   Special Requests BOTTLES DRAWN AEROBIC AND ANAEROBIC 5CC   Final   Culture  Setup Time     Final   Value: 03/03/2014 22:01     [facility]formed at [facility]   Culture     Final   Value: ENTEROBACTER AEROGENES     Note: Gram Stain Report Called to,Read Back By and Verified With: PORTER RN AT [phone] 03/05/14 BY  SNOLO     Performed at [facility]   Report Status 03/07/2014 FINAL   Final   Organism ID, Bacteria ENTEROBACTER AEROGENES   Final  MRSA PCR SCREENING     Status: None   Collection Time    03/03/14 10:04 PM      Result Value Ref Range Status   MRSA by PCR NEGATIVE  NEGATIVE Final   Comment:            The GeneXpert MRSA Assay (FDA     approved for NASAL specimens     only), is one component of a     comprehensive MRSA colonization     surveillance program. It is not     intended to diagnose MRSA     infection nor to guide or     monitor treatment for     MRSA infections.  CLOSTRIDIUM DIFFICILE BY PCR     Status: Abnormal   Collection Time    03/08/14  2:32 PM      Result Value Ref Range Status   C difficile by pcr POSITIVE (*) NEGATIVE Final   Comment: CRITICAL RESULT CALLED TO, READ BACK BY AND VERIFIED WITH:     GLOSSON RN 15:45 03/08/14 ([email or web address])     Studies: No results found.  Scheduled Meds: . antiseptic oral rinse  15 mL Mouth Rinse q12n4p  . chlorhexidine  15 mL Mouth Rinse BID  . insulin aspart  0-15 Units Subcutaneous 6 times per day  . levofloxacin  750 mg Oral Daily  . metoprolol tartrate  12.5 mg Oral BID  . metronidazole  500 mg Intravenous Q8H  . vancomycin  500 mg Per Tube 4 times per day   Continuous Infusions: . sodium chloride 150 mL/hr at 03/12/14 5784    Principal Problem:   Sepsis Active Problems:   Acute renal failure   Acute respiratory failure   DKA (diabetic ketoacidoses)   Metabolic acidosis   Encephalopathy acute   Shock circulatory   CAD- prior CABG 3 times, cath 03/04/14- medical Rx   NSTEMI - type 2   Protein-calorie malnutrition, severe   C. difficile colitis    Time spent: 35 minutes.     Hartley Barefoot A  [facility] Pager [phone]. If 7PM-7AM, please contact night-coverage at [email or web address], password [facility] 03/12/2014, 4:21 PM  LOS: 9 days

## 2014-03-13 LAB — GLUCOSE, CAPILLARY
GLUCOSE-CAPILLARY: 181 mg/dL — AB (ref 70–99)
GLUCOSE-CAPILLARY: 151 mg/dL — AB (ref 70–99)
Glucose-Capillary: 129 mg/dL — ABNORMAL HIGH (ref 70–99)
Glucose-Capillary: 160 mg/dL — ABNORMAL HIGH (ref 70–99)
Glucose-Capillary: 161 mg/dL — ABNORMAL HIGH (ref 70–99)
Glucose-Capillary: 193 mg/dL — ABNORMAL HIGH (ref 70–99)

## 2014-03-13 LAB — CBC
HEMATOCRIT: 34.4 % — AB (ref 39.0–52.0)
Hemoglobin: 11.6 g/dL — ABNORMAL LOW (ref 13.0–17.0)
MCH: 29.9 pg (ref 26.0–34.0)
MCHC: 33.7 g/dL (ref 30.0–36.0)
MCV: 88.7 fL (ref 78.0–100.0)
PLATELETS: 94 10*3/uL — AB (ref 150–400)
RBC: 3.88 MIL/uL — ABNORMAL LOW (ref 4.22–5.81)
RDW: 14.6 % (ref 11.5–15.5)
WBC: 7.7 10*3/uL (ref 4.0–10.5)

## 2014-03-13 LAB — BASIC METABOLIC PANEL
BUN: 8 mg/dL (ref 6–23)
CALCIUM: 6.8 mg/dL — AB (ref 8.4–10.5)
CHLORIDE: 104 meq/L (ref 96–112)
CO2: 26 meq/L (ref 19–32)
CREATININE: 0.54 mg/dL (ref 0.50–1.35)
GFR calc Af Amer: >90 mL/min (ref >90)
GFR calc non Af Amer: >90 mL/min (ref >90)
Glucose, Bld: 201 mg/dL — ABNORMAL HIGH (ref 70–99)
Potassium: 4.4 mEq/L (ref 3.7–5.3)
Sodium: 137 mEq/L (ref 137–147)

## 2014-03-13 MED ORDER — IPRATROPIUM-ALBUTEROL 0.5-2.5 (3) MG/3ML IN SOLN
3.0000 mL | RESPIRATORY_TRACT | Status: DC
  Administered 2014-03-13 (×2): 3 mL via RESPIRATORY_TRACT
  Filled 2014-03-13 (×2): qty 3

## 2014-03-13 MED ORDER — IPRATROPIUM-ALBUTEROL 0.5-2.5 (3) MG/3ML IN SOLN
3.0000 mL | Freq: Two times a day (BID) | RESPIRATORY_TRACT | Status: DC
  Administered 2014-03-14 – 2014-03-15 (×3): 3 mL via RESPIRATORY_TRACT
  Filled 2014-03-13 (×3): qty 3

## 2014-03-13 NOTE — Progress Notes (Signed)
Patient ZO:[id number]:[name] [name]      DOB: 09/09/1929      VWU:[id number]  Consult for PMT evaluation received .  Will be seen and scheduled ASAP this am.  Naleigha Raimondi L. Ladona Ridgelaylor, MD MBA [facility] Palliative Medicine Team at [facility] Team Phone: [phone] Pager: [phone]

## 2014-03-13 NOTE — Progress Notes (Signed)
Patient has coarse bbs with slight exp wheeze in right upper lung throughout, protocol done and patient scored a 4. No treatments indicated but continued BID therapy for questionable history of smoking with some slight exp wheezes prior

## 2014-03-13 NOTE — Progress Notes (Signed)
[facility] PROGRESS NOTE  [name] [name] WUJ:[id number] DOB: Aug 20, 1929 DOA: 03/03/2014 PCP: No primary provider on file.  Assessment/Plan: 1-Severe Sepsis (recurrent & now d/t [facility] 3/17)  In setting C diff colitis and enterobacter Bacteremia.  BP stable. Off pressors.  Continue with IV fluids decrease rate to avoid volume overload.  Continue with antibiotics.   2-Cdiff colitis  Continue with Flagyl and oral vancomycin  day 6.  Suspect recovery from C diff will be slow due to patient need to be on antibiotics.  Decrease IV fluids to aviod volume overload.  Strict I and O.   3-Enterobacter acteremia;  Sensitive to cipro/levaquin.  Day 6/8 antibiotics.  Will repeat Blood culture 3-22.  4-Back pain; tylenol, lydoycane patch. Avoid narcotics to avoid sedation.   Hypophosphatemia; replace with IV on 3-21. Repeat level.   -NSTEMI d/t demand ischemia.  - S/p IV heparin. Off pressors  -Cardiology following.  -EF 60 %   Hypokalemia resolved. Check phosphorus.  DM  Acute renal failure (resolved, but at high risk for recurrent injury w/ hypotension)  Thrombocytopenia: improving  Chronic cervical stenosis  Hx HTN, CAD, MI, s/p CABG   Code Status: Patient daughter died recently. granddaughter unable to make decision at this time. He has a son Trey Paula who the family has not be able to contact. [place] is incline for DNR status. Palliative care consulted, for goals of care meeting.   Family Communication: granddaughter and nice  3-21 Disposition Plan; Remain  inpatient.    Consultants:  Cardiology  Palliative care  Procedures: L IJ CVL 3/12 >>>  Foley 3/12 >>>  ECHO; Severe hypokinesis base inferior segment and base inferolateral segment. Other segments are vigorous. EF maintained at 60%. The cavity size was normal. Wall thickness was normal.    Culture;  3/12 Blood >>> enterobacter  3/12 Urine >>> neg  3/12 MRSA PCR - negative  3/17: c diff: positive    Antibiotics: Vancomycin 3/12>>>3/15  Zosyn 3/12 >>>3/17  levaquin 3/17>>>  Flagyl 3/17>>>  Oral vanc 3/17>>>   HPI/Subjective: Alert, eating breakfast.  No complaints.   Objective: Filed Vitals:   03/13/14 0500  BP: 109/71  Pulse: 83  Temp: 98.1 F (36.7 C)  Resp: 18    Intake/Output Summary (Last 24 hours) at 03/13/14 0851 Last data filed at 03/13/14 0500  Gross per 24 hour  Intake      0 ml  Output   2100 ml  Net  -2100 ml   Filed Weights   03/12/14 0409 03/12/14 1500 03/13/14 0500  Weight: 81.9 kg (180 lb 8.9 oz) 86.818 kg (191 lb 6.4 oz) 89.812 kg (198 lb)    Exam:   General:  Alert, in no distress.   Cardiovascular: S 1, S 2 RRR  Respiratory: fine crackles at bases.   Abdomen: Bs present, soft, mild distended. `  Musculoskeletal: plus 2  edema.   Data Reviewed: Basic Metabolic Panel:  Recent Labs Lab 03/07/14 0519 03/08/14 0405  03/09/14 1525 03/10/14 0835 03/11/14 0405 03/12/14 0556 03/13/14 0509  NA 143 139  < > 140 139 141 140 137  K 3.2* 3.7  < > 3.4* 3.2* 3.1* 3.3* 4.4  CL 104 98  < > 104 103 106 107 104  CO2 28 29  < > 25 23 25 25 26   GLUCOSE 185* 243*  < > 105* 134* 187* 126* 201*  BUN 18 10  < > 10 12 9 8 8   CREATININE 0.72 0.72  < > 0.63 0.57  0.59 0.53 0.54  CALCIUM 7.7* 7.6*  < > 7.2* 7.0* 6.8* 6.8* 6.8*  MG 1.8 1.6  --  1.4*  --   --   --   --   PHOS 1.5* 3.8  --  1.4*  --   --  1.4*  --   < > = values in this interval not displayed. Liver Function Tests:  Recent Labs Lab 03/10/14 0835  AST 21  ALT 16  ALKPHOS 128*  BILITOT 0.4  PROT 4.2*  ALBUMIN 1.3*   No results found for this basename: LIPASE, AMYLASE,  in the last 168 hours No results found for this basename: AMMONIA,  in the last 168 hours CBC:  Recent Labs Lab 03/07/14 0519 03/08/14 0405 03/10/14 0835 03/11/14 0405 03/12/14 0556 03/13/14 0509  WBC 6.2 14.5* 9.1 8.7 9.4 7.7  NEUTROABS 5.6 13.2* 8.5*  --   --   --   HGB 13.3 12.8* 11.1* 10.9*  11.4* 11.6*  HCT 40.2 38.9* 33.3* 32.5* 34.3* 34.4*  MCV 89.3 89.6 89.8 89.3 88.6 88.7  PLT 44* 48* 75* 81* 82* 94*   Cardiac Enzymes:  Recent Labs Lab 03/08/14 1800 03/09/14 0900 03/09/14 1525 03/09/14 2100  TROPONINI 1.04* 0.80* 0.79* 0.74*   BNP (last 3 results)  Recent Labs  03/05/14 0445 03/08/14 0623  PROBNP 6895.0* 7032.0*   CBG:  Recent Labs Lab 03/12/14 1650 03/12/14 2019 03/12/14 2343 03/13/14 0448 03/13/14 0843  GLUCAP 190* 186* 193* 181* 151*    Recent Results (from the past 240 hour(s))  URINE CULTURE     Status: None   Collection Time    03/03/14  5:21 PM      Result Value Ref Range Status   Specimen Description URINE, CATHETERIZED   Final   Special Requests NONE   Final   Culture  Setup Time     Final   Value: 03/03/2014 18:57     Performed at [facility]   Colony Count     Final   Value: NO GROWTH     Performed at [facility]   Culture     Final   Value: NO GROWTH     Performed at [facility]   Report Status 03/04/2014 FINAL   Final  CULTURE, BLOOD (ROUTINE X 2)     Status: None   Collection Time    03/03/14  6:40 PM      Result Value Ref Range Status   Specimen Description BLOOD HAND RIGHT   Final   Special Requests BOTTLES DRAWN AEROBIC AND ANAEROBIC 5CCBLUE 4CCRED   Final   Culture  Setup Time     Final   Value: 03/03/2014 21:58     Performed at [facility]   Culture     Final   Value: NO GROWTH 5 DAYS     Performed at [facility]   Report Status 03/09/2014 FINAL   Final  CULTURE, BLOOD (ROUTINE X 2)     Status: None   Collection Time    03/03/14  6:52 PM      Result Value Ref Range Status   Specimen Description BLOOD HAND LEFT   Final   Special Requests BOTTLES DRAWN AEROBIC AND ANAEROBIC 5CC   Final   Culture  Setup Time     Final   Value: 03/03/2014 22:01     Performed at [facility]   Culture     Final   Value: ENTEROBACTER AEROGENES  Note: Gram Stain Report  Called to,Read Back By and Verified With: PORTER RN AT [phone] 03/05/14 BY SNOLO     Performed at [facility]   Report Status 03/07/2014 FINAL   Final   Organism ID, Bacteria ENTEROBACTER AEROGENES   Final  MRSA PCR SCREENING     Status: None   Collection Time    03/03/14 10:04 PM      Result Value Ref Range Status   MRSA by PCR NEGATIVE  NEGATIVE Final   Comment:            The GeneXpert MRSA Assay (FDA     approved for NASAL specimens     only), is one component of a     comprehensive MRSA colonization     surveillance program. It is not     intended to diagnose MRSA     infection nor to guide or     monitor treatment for     MRSA infections.  CLOSTRIDIUM DIFFICILE BY PCR     Status: Abnormal   Collection Time    03/08/14  2:32 PM      Result Value Ref Range Status   C difficile by pcr POSITIVE (*) NEGATIVE Final   Comment: CRITICAL RESULT CALLED TO, READ BACK BY AND VERIFIED WITH:     GLOSSON RN 15:45 03/08/14 ([email or web address])     Studies: No results found.  Scheduled Meds: . antiseptic oral rinse  15 mL Mouth Rinse q12n4p  . chlorhexidine  15 mL Mouth Rinse BID  . insulin aspart  0-15 Units Subcutaneous 6 times per day  . levofloxacin  750 mg Oral Daily  . metoprolol tartrate  12.5 mg Oral BID  . metronidazole  500 mg Intravenous Q8H  . potassium chloride  40 mEq Oral BID  . vancomycin  500 mg Per Tube 4 times per day   Continuous Infusions: . sodium chloride 150 mL/hr at 03/12/14 9604    Principal Problem:   Sepsis Active Problems:   Acute renal failure   Acute respiratory failure   DKA (diabetic ketoacidoses)   Metabolic acidosis   Encephalopathy acute   Shock circulatory   CAD- prior CABG 3 times, cath 03/04/14- medical Rx   NSTEMI - type 2   Protein-calorie malnutrition, severe   C. difficile colitis    Time spent: 35 minutes.     Hartley Barefoot A  [facility] Pager [phone]. If 7PM-7AM, please contact night-coverage at [email or web address],  password [facility] 03/13/2014, 8:51 AM  LOS: 10 days

## 2014-03-13 NOTE — Progress Notes (Signed)
Subjective:  C/o weakness of arms and legs. C/o wheezing  Objective:  Vital Signs in the last 24 hours: BP 109/71  Pulse 83  Temp(Src) 98.1 F (36.7 C) (Oral)  Resp 18  Ht 5\' 9"  (1.753 m)  Wt 89.812 kg (198 lb)  BMI 29.23 kg/m2  SpO2 97%  Physical Exam: Elderly WM in NAD Lungs:  Mild bilateral wheeze noted Cardiac:  Regular rhythm, normal S1 and S2, no S3 Abdomen:  Soft, nontender, no masses Extremities:  No edema present  Intake/Output from previous day: 03/21 0701 - 03/22 0700 In: -  Out: 2100 [Urine:1500; Stool:600]  Weight Filed Weights   03/12/14 0409 03/12/14 1500 03/13/14 0500  Weight: 81.9 kg (180 lb 8.9 oz) 86.818 kg (191 lb 6.4 oz) 89.812 kg (198 lb)    Lab Results: Basic Metabolic Panel:  Recent Labs  16/09/9602/21/15 0556 03/13/14 0509  NA 140 137  K 3.3* 4.4  CL 107 104  CO2 25 26  GLUCOSE 126* 201*  BUN 8 8  CREATININE 0.53 0.54   CBC:  Recent Labs  03/12/14 0556 03/13/14 0509  WBC 9.4 7.7  HGB 11.4* 11.6*  HCT 34.3* 34.4*  MCV 88.6 88.7  PLT 82* 94*   Telemetry: Heart rate up.  Sinus with PVC's  Assessment/Plan:  1. Sepsis syndrome 2. Prior CAD with CABG and patent grafts.  Rec:  Continue to reinitiate card meds.  Palliative care will be seeing patient.      Darden PalmerW. Spencer Lillian Tigges, Jr.  MD [facility]  03/13/2014, 10:56 AM

## 2014-03-13 NOTE — Progress Notes (Signed)
Thank you for consulting the Palliative Medicine Team at [facility] to meet your patient's and family's needs.   The reason that you asked us to see your patient is  For Clarification of GOC and options  We have scheduled your patient for a meeting: 03-14-14 at 2 pm with Lorinda CreedMary Ivan Lacher NP  The Surrogate decision make is:  Serafina MitchellCassandra Marshall  (not documented) Contact information: # [phone]  Lorinda CreedMary Gradie Butrick NP  Palliative Medicine Team Team Phone # [phone] Pager [phone]

## 2014-03-14 DIAGNOSIS — Z515 Encounter for palliative care: Secondary | ICD-10-CM

## 2014-03-14 DIAGNOSIS — R5383 Other fatigue: Secondary | ICD-10-CM

## 2014-03-14 DIAGNOSIS — I214 Non-ST elevation (NSTEMI) myocardial infarction: Secondary | ICD-10-CM

## 2014-03-14 DIAGNOSIS — R5381 Other malaise: Secondary | ICD-10-CM

## 2014-03-14 LAB — CBC
HEMATOCRIT: 34.5 % — AB (ref 39.0–52.0)
HEMOGLOBIN: 11.4 g/dL — AB (ref 13.0–17.0)
MCH: 29.5 pg (ref 26.0–34.0)
MCHC: 33 g/dL (ref 30.0–36.0)
MCV: 89.1 fL (ref 78.0–100.0)
Platelets: 92 10*3/uL — ABNORMAL LOW (ref 150–400)
RBC: 3.87 MIL/uL — AB (ref 4.22–5.81)
RDW: 14.6 % (ref 11.5–15.5)
WBC: 6.1 10*3/uL (ref 4.0–10.5)

## 2014-03-14 LAB — GLUCOSE, CAPILLARY
GLUCOSE-CAPILLARY: 149 mg/dL — AB (ref 70–99)
Glucose-Capillary: 129 mg/dL — ABNORMAL HIGH (ref 70–99)
Glucose-Capillary: 149 mg/dL — ABNORMAL HIGH (ref 70–99)
Glucose-Capillary: 171 mg/dL — ABNORMAL HIGH (ref 70–99)
Glucose-Capillary: 219 mg/dL — ABNORMAL HIGH (ref 70–99)
Glucose-Capillary: 241 mg/dL — ABNORMAL HIGH (ref 70–99)

## 2014-03-14 LAB — BASIC METABOLIC PANEL
BUN: 10 mg/dL (ref 6–23)
CHLORIDE: 104 meq/L (ref 96–112)
CO2: 26 mEq/L (ref 19–32)
Calcium: 7 mg/dL — ABNORMAL LOW (ref 8.4–10.5)
Creatinine, Ser: 0.58 mg/dL (ref 0.50–1.35)
GFR calc Af Amer: >90 mL/min (ref >90)
GFR calc non Af Amer: >90 mL/min (ref >90)
GLUCOSE: 154 mg/dL — AB (ref 70–99)
POTASSIUM: 4.3 meq/L (ref 3.7–5.3)
Sodium: 138 mEq/L (ref 137–147)

## 2014-03-14 MED ORDER — METRONIDAZOLE 500 MG PO TABS
500.0000 mg | ORAL_TABLET | Freq: Three times a day (TID) | ORAL | Status: DC
  Administered 2014-03-14 – 2014-03-15 (×4): 500 mg via ORAL
  Filled 2014-03-14 (×9): qty 1

## 2014-03-14 MED ORDER — GLUCERNA SHAKE PO LIQD
237.0000 mL | Freq: Three times a day (TID) | ORAL | Status: DC
  Administered 2014-03-14 – 2014-03-15 (×4): 237 mL via ORAL

## 2014-03-14 MED ORDER — ASPIRIN 81 MG PO CHEW
81.0000 mg | CHEWABLE_TABLET | Freq: Every day | ORAL | Status: DC
  Administered 2014-03-14 – 2014-03-15 (×2): 81 mg via ORAL
  Filled 2014-03-14 (×2): qty 1

## 2014-03-14 NOTE — Progress Notes (Signed)
Physical Therapy Treatment Patient Details Name: Beverly GustRobert Nisley MRN: [id number] DOB: 07/16/1929 Today's Date: 03/14/2014 Time: [id number] PT Time Calculation (min): 47 min  PT Assessment / Plan / Recommendation  History of Present Illness 80 y.o. male [facility] with 80 yo male with hx HTN, DM, CAD, MI presented 3/12 after being found unresponsive.  Pt last seen ~5 days prior to admit by family at which time he was reportedly confused, not himself.  Neighbors called EMS d/t multiple newspapers outside.  Unresponsive, hypotensive, hypothermic on EMS arrival, found in urine and fesces with multiple pressure ulcers.  ?DKA. BP 80's in ER despite 3L fluids, hypothermic with temp 91.   The pt was dx with septic shock, acute renal failure, DKA, CAD/NSTEMI with h/o CABG.  Cath 3/13 showed severe native 3 vessel CAD with total occlusion of the LM and RCA , patency of the free RIMA-LAD, LIMA-diagonal, and SVG-PDA, total occlusion of the SVG-LCx (appears chronic) and low LVEDP most consistent with septic/vasodiliatory shock.  He also has a contusion on the left side of his forhead.     PT Comments   Pt with improved sitting balance today.  I was able to get him out of bed with the lift machine to get the benefits of more upright sitting position.  He is very pale and ill looking with significant LE edema.  He has very little tolerance of upright sitting due to lightheadedness and lower back pain in sitting.  He will require prolonged rehab if he ever walks again.  PT will continue to follow acutely.  He continues to be appropriate for SNF level rehab.   Follow Up Recommendations  SNF     Does the patient have the potential to tolerate intense rehabilitation    NA  Barriers to Discharge   None      Equipment Recommendations  Wheelchair (measurements PT);Wheelchair cushion (measurements PT);Hospital bed;Other (comment) (hoyer lift)    Recommendations for Other Services   None  Frequency Min  2X/week   Progress towards PT Goals Progress towards PT goals: Progressing toward goals  Plan Current plan remains appropriate    Precautions / Restrictions Precautions Precautions: Fall Precaution Comments: very weak and debilitated   Pertinent Vitals/Pain See vitals flow sheet.     Mobility  Bed Mobility Overal bed mobility: Needs Assistance Bed Mobility: Rolling;Sidelying to Sit;Sit to Supine Rolling: Mod assist Sidelying to sit: Max assist Sit to supine: Max assist General bed mobility comments: Assist needed for rolling to help pt reach the bed rail and then to bend his knees and progress his hips over.  He was able to help support his trunk during rolling bil.   Transfers Overall transfer level: Needs assistance Equipment used:  (maxi move lift) General transfer comment: Used Maxi move lift to get pt into the chair from the bed.  This is the first time he has been OOB since his admission on 03/03/14.      Exercises General Exercises - Lower Extremity Ankle Circles/Pumps: AROM;Both;5 reps;Seated Long Arc Quad: AROM;Both;5 reps;Seated    PT Goals (current goals can now be found in the care plan section) Acute Rehab PT Goals Patient Stated Goal: to go home  Visit Information  Last PT Received On: 03/14/14 Assistance Needed: +2 History of Present Illness: 80 y.o. male [facility] with 80 yo male with hx HTN, DM, CAD, MI presented 3/12 after being found unresponsive.  Pt last seen ~5 days prior to admit  by family at which time he was reportedly confused, not himself.  Neighbors called EMS d/t multiple newspapers outside.  Unresponsive, hypotensive, hypothermic on EMS arrival, found in urine and fesces with multiple pressure ulcers.  ?DKA. BP 80's in ER despite 3L fluids, hypothermic with temp 91.   The pt was dx with septic shock, acute renal failure, DKA, CAD/NSTEMI with h/o CABG.  Cath 3/13 showed severe native 3 vessel CAD with total occlusion of the LM and RCA ,  patency of the free RIMA-LAD, LIMA-diagonal, and SVG-PDA, total occlusion of the SVG-LCx (appears chronic) and low LVEDP most consistent with septic/vasodiliatory shock.  He also has a contusion on the left side of his forhead.      Subjective Data  Subjective: Pt is repeating questions and information given multiple times during this session.   Patient Stated Goal: to go home   Cognition  Cognition Arousal/Alertness: Lethargic Behavior During Therapy: WFL for tasks assessed/performed Overall Cognitive Status: Impaired/Different from baseline Area of Impairment: Attention;Memory;Following commands;Safety/judgement;Awareness;Problem solving Orientation Level: Disoriented to;Place;Time;Situation Current Attention Level: Sustained Memory: Decreased short-term memory Following Commands: Follows one step commands with increased time Safety/Judgement: Decreased awareness of safety;Decreased awareness of deficits Awareness: Intellectual Problem Solving: Slow processing;Decreased initiation;Difficulty sequencing;Requires verbal cues;Requires tactile cues    Balance  Balance Overall balance assessment: Needs assistance Sitting-balance support: Bilateral upper extremity supported;Feet supported Sitting balance-Leahy Scale: Poor Sitting balance - Comments: up to mod assist sitting EOB.  Pt started as mod assist, but once squared up on the bed and with bil arm support he was able to get to min assist level sitting EOB. He has very poor tolerance of sitting between his back pain and feeling lightheaded.  He was able to preform ankle pumps and LAQs ( a few reps each in sitting with verbal and visual demonstration.   End of Session PT - End of Session Equipment Utilized During Treatment: Other (comment) (maxi move lift) Activity Tolerance: Patient limited by pain;Patient limited by fatigue Patient left: in chair;with call bell/phone within reach Nurse Communication: Mobility status;Need for lift  equipment     Ether Wolters B. Lyanne Kates, PT, DPT [phone]   03/14/2014, 6:25 PM

## 2014-03-14 NOTE — Progress Notes (Addendum)
NUTRITION FOLLOW-UP  DOCUMENTATION CODES  Per approved criteria   -Severe malnutrition in the context of acute illness   INTERVENTION:  Glucerna Shake PO TID, each supplement provides 220 kcal and 10 grams of protein.   NUTRITION DIAGNOSIS: Inadequate oral intake r/t AMS and variable appetite AEB limited po intake. Ongoing.  Goal: Patient will meet >/=90% of estimated nutrition needs, progressing.  Monitor:  Diet tolerance, PO intake, weight trend, labs.  ASSESSMENT: 80 year old male with history of hypertension, diabetes, CAD, MI, found unresponsive. Found to have SIRS, sepsis, acute renal failure, DKA, acute encephalopathy, and acute NSTEMI.   Diet has been advanced to CHO-modified, Heart Healthy with thin liquids. S/P bedside swallow evaluation on 3/19 with SLP. Intake remains variable 25-100% meal completion. Palliative Care Team meeting scheduled for today.  C. Diff +  Pt meets criteria for severe MALNUTRITION in the context of acute illness as evidenced by intake of <50% x at least 5 days and severe muscle depletion.   Height: Ht Readings from Last 1 Encounters:  03/03/14 5\' 9"  (1.753 m)    Weight: Wt Readings from Last 1 Encounters:  03/14/14 186 lb 6.4 oz (84.55 kg)  Admit wt 142 lb  BMI:  Body mass index is 27.51 kg/(m^2).   Estimated Nutritional Needs: Kcal: 1650-1800 kcal Protein: 85-95 g Fluid: >2.1 L/day  Skin:  stage I pressure ulcers sacrum and heel stage II pressure ulcers coccyx, hip  Diet Order: Carb Control   Intake/Output Summary (Last 24 hours) at 03/14/14 1023 Last data filed at 03/14/14 0500  Gross per 24 hour  Intake    490 ml  Output   1350 ml  Net   -860 ml    Last BM: 3/19 (rectal tube)  Labs:   Recent Labs Lab 03/08/14 0405  03/09/14 1525  03/12/14 0556 03/13/14 0509 03/14/14 0500  NA 139  < > 140  < > 140 137 138  K 3.7  < > 3.4*  < > 3.3* 4.4 4.3  CL 98  < > 104  < > 107 104 104  CO2 29  < > 25  < > 25 26 26    BUN 10  < > 10  < > 8 8 10   CREATININE 0.72  < > 0.63  < > 0.53 0.54 0.58  CALCIUM 7.6*  < > 7.2*  < > 6.8* 6.8* 7.0*  MG 1.6  --  1.4*  --   --   --   --   PHOS 3.8  --  1.4*  --  1.4*  --   --   GLUCOSE 243*  < > 105*  < > 126* 201* 154*  < > = values in this interval not displayed.  CBG (last 3)   Recent Labs  03/14/14 0026 03/14/14 0352 03/14/14 0734  GLUCAP 171* 149* 129*    Scheduled Meds: . antiseptic oral rinse  15 mL Mouth Rinse q12n4p  . aspirin  81 mg Oral Daily  . insulin aspart  0-15 Units Subcutaneous 6 times per day  . ipratropium-albuterol  3 mL Nebulization BID  . levofloxacin  750 mg Oral Daily  . metoprolol tartrate  12.5 mg Oral BID  . metronidazole  500 mg Intravenous Q8H  . vancomycin  500 mg Per Tube 4 times per day    Continuous Infusions: . sodium chloride 50 mL/hr at 03/13/14 0846    Joaquin CourtsKimberly Harris, RD, LDN, CNSC Pager [phone] After Hours Pager [phone]

## 2014-03-14 NOTE — Progress Notes (Signed)
[facility] [facility] [place] [facility] [place] [facility] [place] [facility].     Sabino NiemannAmy Arneisha Kincannon, MSW, [employer]CSWA [phone]

## 2014-03-14 NOTE — Progress Notes (Signed)
[facility] PROGRESS NOTE  [name] [name] Common ZOX:[id number] DOB: 12/08/1929 DOA: 03/03/2014 PCP: No primary provider on file.  Assessment/Plan: 1-Severe Sepsis ([facility] & [facility]  In setting C diff colitis and enterobacter Bacteremia.  BP stable. Off pressors.  Will NSL if BP remain stable.  Continue with antibiotics.   2-Cdiff colitis  Continue with Flagyl and oral vancomycin  day 7.  Suspect recovery from C diff will be slow due to patient need to be on antibiotics.  Decrease IV fluids to aviod volume overload.   3-Enterobacter acteremia;  Sensitive to cipro/levaquin.  Day 7/8 antibiotics.  repeat Blood culture 3-22 no growth to date.  4-Back pain; tylenol, lydoycane patch. Avoid narcotics to avoid sedation.   Hypophosphatemia; replace with IV on 3-21. Repeat level.   -NSTEMI d/t demand ischemia.  - S/p IV heparin. Off pressors  -no further evaluation. Cardiology sign off.  -EF 60 %   Hypokalemia resolved.  DM  Acute renal failure (resolved, but at high risk for recurrent injury w/ hypotension)  Thrombocytopenia: improving  Chronic cervical stenosis  Hx HTN, CAD, MI, s/p CABG   Code Status: Full code.    Family Communication: granddaughter and nice  3-21. Goals of  care meeting today.  Disposition Plan; Remain  inpatient.     Consultants:  Cardiology  Palliative care  Procedures: L IJ CVL 3/12 >>>  Foley 3/12 >>>  ECHO; Severe hypokinesis base inferior segment and base inferolateral segment. Other segments are vigorous. EF maintained at 60%. The cavity size was normal. Wall thickness was normal.    Culture;  3/12 Blood >>> enterobacter  3/12 Urine >>> neg  3/12 MRSA PCR - negative  3/17: c diff: positive   Antibiotics: Vancomycin 3/12>>>3/15  Zosyn 3/12 >>>3/17  levaquin 3/17>>>  Flagyl 3/17>>>  Oral vanc 3/17>>>   HPI/Subjective: Sitting in the bed, No complaints.   Objective: Filed Vitals:   03/14/14 1134  BP: 111/59   Pulse:   Temp: 98 F (36.7 C)  Resp:     Intake/Output Summary (Last 24 hours) at 03/14/14 1534 Last data filed at 03/14/14 1200  Gross per 24 hour  Intake    820 ml  Output    700 ml  Net    120 ml   Filed Weights   03/12/14 1500 03/13/14 0500 03/14/14 0400  Weight: 86.818 kg (191 lb 6.4 oz) 89.812 kg (198 lb) 84.55 kg (186 lb 6.4 oz)    Exam:   General:  Alert, in no distress.   Cardiovascular: S 1, S 2 RRR  Respiratory: fine crackles at bases.   Abdomen: Bs present, soft, mild distended. `  Musculoskeletal: plus 2  edema.   Data Reviewed: Basic Metabolic Panel:  Recent Labs Lab 03/08/14 0405  03/09/14 1525 03/10/14 0835 03/11/14 0405 03/12/14 0556 03/13/14 0509 03/14/14 0500  NA 139  < > 140 139 141 140 137 138  K 3.7  < > 3.4* 3.2* 3.1* 3.3* 4.4 4.3  CL 98  < > 104 103 106 107 104 104  CO2 29  < > 25 23 25 25 26 26   GLUCOSE 243*  < > 105* 134* 187* 126* 201* 154*  BUN 10  < > 10 12 9 8 8 10   CREATININE 0.72  < > 0.63 0.57 0.59 0.53 0.54 0.58  CALCIUM 7.6*  < > 7.2* 7.0* 6.8* 6.8* 6.8* 7.0*  MG 1.6  --  1.4*  --   --   --   --   --  PHOS 3.8  --  1.4*  --   --  1.4*  --   --   < > = values in this interval not displayed. Liver Function Tests:  Recent Labs Lab 03/10/14 0835  AST 21  ALT 16  ALKPHOS 128*  BILITOT 0.4  PROT 4.2*  ALBUMIN 1.3*   No results found for this basename: LIPASE, AMYLASE,  in the last 168 hours No results found for this basename: AMMONIA,  in the last 168 hours CBC:  Recent Labs Lab 03/08/14 0405 03/10/14 0835 03/11/14 0405 03/12/14 0556 03/13/14 0509 03/14/14 0500  WBC 14.5* 9.1 8.7 9.4 7.7 6.1  NEUTROABS 13.2* 8.5*  --   --   --   --   HGB 12.8* 11.1* 10.9* 11.4* 11.6* 11.4*  HCT 38.9* 33.3* 32.5* 34.3* 34.4* 34.5*  MCV 89.6 89.8 89.3 88.6 88.7 89.1  PLT 48* 75* 81* 82* 94* 92*   Cardiac Enzymes:  Recent Labs Lab 03/08/14 1800 03/09/14 0900 03/09/14 1525 03/09/14 2100  TROPONINI 1.04* 0.80*  0.79* 0.74*   BNP (last 3 results)  Recent Labs  03/05/14 0445 03/08/14 0623  PROBNP 6895.0* 7032.0*   CBG:  Recent Labs Lab 03/13/14 2030 03/14/14 0026 03/14/14 0352 03/14/14 0734 03/14/14 1143  GLUCAP 161* 171* 149* 129* 149*    Recent Results (from the past 240 hour(s))  CLOSTRIDIUM DIFFICILE BY PCR     Status: Abnormal   Collection Time    03/08/14  2:32 PM      Result Value Ref Range Status   C difficile by pcr POSITIVE (*) NEGATIVE Final   Comment: CRITICAL RESULT CALLED TO, READ BACK BY AND VERIFIED WITH:     GLOSSON RN 15:45 03/08/14 ([email or web address])  CULTURE, BLOOD (ROUTINE X 2)     Status: None   Collection Time    03/13/14  2:55 PM      Result Value Ref Range Status   Specimen Description BLOOD LEFT ARM   Final   Special Requests BOTTLES DRAWN AEROBIC AND ANAEROBIC 10CC   Final   Culture  Setup Time     Final   Value: 03/13/2014 20:26     Performed at [facility]   Culture     Final   Value:        BLOOD CULTURE RECEIVED NO GROWTH TO DATE CULTURE WILL BE HELD FOR 5 DAYS BEFORE ISSUING A FINAL NEGATIVE REPORT     Performed at [facility]   Report Status PENDING   Incomplete     Studies: No results found.  Scheduled Meds: . antiseptic oral rinse  15 mL Mouth Rinse q12n4p  . aspirin  81 mg Oral Daily  . feeding supplement (GLUCERNA SHAKE)  237 mL Oral TID BM  . insulin aspart  0-15 Units Subcutaneous 6 times per day  . ipratropium-albuterol  3 mL Nebulization BID  . levofloxacin  750 mg Oral Daily  . metoprolol tartrate  12.5 mg Oral BID  . metroNIDAZOLE  500 mg Oral 3 times per day  . vancomycin  500 mg Per Tube 4 times per day   Continuous Infusions: . sodium chloride 50 mL/hr at 03/13/14 1610    Principal Problem:   Sepsis Active Problems:   Acute renal failure   Acute respiratory failure   DKA (diabetic ketoacidoses)   Metabolic acidosis   Encephalopathy acute   Shock circulatory   CAD- prior CABG 3 times, cath 03/04/14-  medical Rx   NSTEMI - type  2   Protein-calorie malnutrition, severe   C. difficile colitis    Time spent: 25 minutes.     Hartley Barefoot A  [facility] Pager [phone]. If 7PM-7AM, please contact night-coverage at [email or web address], password [facility] 03/14/2014, 3:34 PM  LOS: 11 days

## 2014-03-14 NOTE — Progress Notes (Signed)
    Subjective:  "Weak", alert and responsive.  Objective:  Vital Signs in the last 24 hours: Temp:  [97.7 F (36.5 C)-98.1 F (36.7 C)] 97.7 F (36.5 C) (03/23 0400) Pulse Rate:  [72-88] 82 (03/23 0400) Resp:  [16] 16 (03/23 0400) BP: (93-100)/(56-59) 93/59 mmHg (03/23 0400) SpO2:  [96 %-100 %] 98 % (03/23 0400) Weight:  [186 lb 6.4 oz (84.55 kg)] 186 lb 6.4 oz (84.55 kg) (03/23 0400)  Intake/Output from previous day:  Intake/Output Summary (Last 24 hours) at 03/14/14 [id number] Last data filed at 03/14/14 0500  Gross per 24 hour  Intake    710 ml  Output   1350 ml  Net   -640 ml    Physical Exam: General appearance: alert, cooperative, no distress and pale Lungs: clear to auscultation bilaterally Heart: regular rate and rhythm   Rate: 78  Rhythm: normal sinus rhythm, premature atrial contractions (PAC) and premature ventricular contractions (PVC)  Lab Results:  Recent Labs  03/13/14 0509 03/14/14 0500  WBC 7.7 6.1  HGB 11.6* 11.4*  PLT 94* 92*    Recent Labs  03/13/14 0509 03/14/14 0500  NA 137 138  K 4.4 4.3  CL 104 104  CO2 26 26  GLUCOSE 201* 154*  BUN 8 10  CREATININE 0.54 0.58   No results found for this basename: TROPONINI, CK, MB,  in the last 72 hours No results found for this basename: INR,  in the last 72 hours  Imaging: Imaging results have been reviewed  Cardiac Studies:  Assessment/Plan:   Principal Problem:   Sepsis Active Problems:   NSTEMI - type 2   CAD- prior CABG 3 times, cath 03/04/14- medical Rx   C. difficile colitis   Acute renal failure   Acute respiratory failure   DKA (diabetic ketoacidoses)   Metabolic acidosis   Encephalopathy acute   Shock circulatory   Protein-calorie malnutrition, severe    PLAN: Meeting with family today. Same cardiac Rx. He is tolerating low dose beta blocker. Add aspirin 81 mg.   Corine ShelterLuke Kilroy PA-C Beeper [phone] 03/14/2014, 7:14 AM   Patient seen, examined. Available data reviewed.  Agree with findings, assessment, and plan as outlined by Corine ShelterLuke Kilroy, PA-C. The patient is a pleasant, elderly gentleman in no distress. He is hungry and asking for something he. He denies any pain. Heart is regular rate and rhythm without murmurs. His lung fields are diminished at the bases but otherwise clear. There is trace peripheral edema. His abdomen is soft and nontender. Telemetry demonstrates sinus rhythm with PACs. Plan as noted for meeting with the palliative care team today. His main issue is recovery from severe sepsis and metabolic encephalopathy. I saw him early in his hospitalization and perform his heart catheterization which demonstrated stable coronary anatomy with continued patency of his bypass grafts. He is on aspirin and a low-dose beta blocker. With plans for palliative care, I would not add a statin drug. Not much else had this time from a cardiac perspective. Please call if any other cardiac issues or questions arise. Thank you.  Tonny BollmanMichael Mallori Araque, M.D. 03/14/2014 7:47 AM

## 2014-03-14 NOTE — Progress Notes (Signed)
Central line discontinued as ordered, Tip intact. Per Dr Sunnie Nielsenegalado, place PIV X2. No labs today. If MAP continues to maintain over 65, discontinued IV fluids

## 2014-03-14 NOTE — Progress Notes (Signed)
PHARMACIST - PHYSICIAN COMMUNICATION DR:   Sunnie Nielsenegalado CONCERNING: Antibiotic IV to Oral Route Change Policy  RECOMMENDATION: This patient is receiving metronidazole by the intravenous route.  Based on criteria approved by the Pharmacy and Therapeutics Committee, the antibiotic(s) is/are being converted to the equivalent oral dose form(s).   DESCRIPTION: These criteria include:  Patient being treated for a respiratory tract infection, urinary tract infection, cellulitis or clostridium difficile associated diarrhea if on metronidazole  The patient is not neutropenic and does not exhibit a GI malabsorption state  The patient is eating (either orally or via tube) and/or has been taking other orally administered medications for a least 24 hours  The patient is improving clinically and has a Tmax < 100.5  If you have questions about this conversion, please contact the Pharmacy Department  []   [phone]( [phone] )  [facility] [x]   [phone]( [phone] )  [facility]  []   [phone]( [phone] )  [facility] []   [phone]( [phone] )  [facility]   Celedonio MiyamotoJeremy Mohamedamin Nifong, PharmD, BCPS Clinical Pharmacist Pager [phone]

## 2014-03-14 NOTE — Progress Notes (Addendum)
CSW (Clinical Education officer, museum) made aware that during palliative meeting pt granddaughter, Vito Backers, said some concerning things. CSW met with pt granddaughter and pt sister, [name] Polkinhorn, and discussed concerns. Pt granddaughter became aware that pt has been writing checks out to cash for around a month now. [name] concerned because pt has been confused for this time period and also some of the checks do not look to be pt handwriting. CSW has called Adult Protective Services (APS) to make a report. Left voicemail as they did not answer.   [name] also stated that they are unsure if there is an official POA for pt. At this time, Vito Backers has been the only reliable contact and visitor that pt has had so she has been making pt decisions.    ADDENDUM 4:25pm: CSW received call from from [place] case worker and made report. At this time, since dc plan is for SNF, APS will not be following up but report has been noted in case future reports are also made. APS will however be following up with pt granddaughter to notify if pt does have POA in place and what steps to take if she would like to appeal this.  [place], [place]
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
## 2014-03-15 LAB — GLUCOSE, CAPILLARY
GLUCOSE-CAPILLARY: 130 mg/dL — AB (ref 70–99)
Glucose-Capillary: 135 mg/dL — ABNORMAL HIGH (ref 70–99)
Glucose-Capillary: 144 mg/dL — ABNORMAL HIGH (ref 70–99)
Glucose-Capillary: 183 mg/dL — ABNORMAL HIGH (ref 70–99)

## 2014-03-15 LAB — BASIC METABOLIC PANEL
BUN: 11 mg/dL (ref 6–23)
CHLORIDE: 105 meq/L (ref 96–112)
CO2: 25 mEq/L (ref 19–32)
Calcium: 7.3 mg/dL — ABNORMAL LOW (ref 8.4–10.5)
Creatinine, Ser: 0.56 mg/dL (ref 0.50–1.35)
GFR calc non Af Amer: >90 mL/min (ref >90)
Glucose, Bld: 154 mg/dL — ABNORMAL HIGH (ref 70–99)
Potassium: 4.4 mEq/L (ref 3.7–5.3)
Sodium: 139 mEq/L (ref 137–147)

## 2014-03-15 LAB — CBC
HCT: 34.5 % — ABNORMAL LOW (ref 39.0–52.0)
Hemoglobin: 11.4 g/dL — ABNORMAL LOW (ref 13.0–17.0)
MCH: 29.3 pg (ref 26.0–34.0)
MCHC: 33 g/dL (ref 30.0–36.0)
MCV: 88.7 fL (ref 78.0–100.0)
PLATELETS: 104 10*3/uL — AB (ref 150–400)
RBC: 3.89 MIL/uL — ABNORMAL LOW (ref 4.22–5.81)
RDW: 14.6 % (ref 11.5–15.5)
WBC: 5.3 10*3/uL (ref 4.0–10.5)

## 2014-03-15 LAB — PHOSPHORUS: PHOSPHORUS: 2 mg/dL — AB (ref 2.3–4.6)

## 2014-03-15 MED ORDER — ASPIRIN 81 MG PO CHEW: 81.0000 mg | CHEWABLE_TABLET | Freq: Every day | ORAL | Status: AC

## 2014-03-15 MED ORDER — GLUCERNA SHAKE PO LIQD: 237.0000 mL | Freq: Three times a day (TID) | ORAL | Status: AC

## 2014-03-15 MED ORDER — K PHOS MONO-SOD PHOS DI & MONO 155-852-130 MG PO TABS
500.0000 mg | ORAL_TABLET | Freq: Once | ORAL | Status: AC
  Administered 2014-03-15: 500 mg via ORAL
  Filled 2014-03-15: qty 2

## 2014-03-15 MED ORDER — IPRATROPIUM-ALBUTEROL 0.5-2.5 (3) MG/3ML IN SOLN: 3.0000 mL | Freq: Two times a day (BID) | RESPIRATORY_TRACT | Status: AC

## 2014-03-15 MED ORDER — LORAZEPAM 2 MG PO TABS: 2.0000 mg | ORAL_TABLET | Freq: Every day | ORAL | Status: AC | PRN

## 2014-03-15 MED ORDER — METRONIDAZOLE 500 MG PO TABS: 500.0000 mg | ORAL_TABLET | Freq: Three times a day (TID) | ORAL | Status: DC

## 2014-03-15 MED ORDER — METRONIDAZOLE 500 MG PO TABS: 500.0000 mg | ORAL_TABLET | Freq: Three times a day (TID) | ORAL | Status: AC

## 2014-03-15 MED ORDER — ACETAMINOPHEN 325 MG PO TABS: 650.0000 mg | ORAL_TABLET | Freq: Four times a day (QID) | ORAL | Status: AC | PRN

## 2014-03-15 MED ORDER — VANCOMYCIN 50 MG/ML ORAL SOLUTION: 500.0000 mg | Freq: Four times a day (QID) | ORAL | Status: AC

## 2014-03-15 MED ORDER — METOPROLOL TARTRATE 12.5 MG HALF TABLET: 25.0000 mg | ORAL_TABLET | Freq: Two times a day (BID) | ORAL | Status: AC

## 2014-03-15 MED ORDER — VANCOMYCIN 50 MG/ML ORAL SOLUTION: 500.0000 mg | Freq: Four times a day (QID) | ORAL | Status: DC

## 2014-03-15 NOTE — Progress Notes (Signed)
Clinical social worker assisted with patient discharge to skilled nursing facility, [facility].  CSW addressed all family questions and concerns. CSW copied chart and added all important documents. CSW also set up patient transportation with [facility]. Clinical Social Worker will sign off for now as social work intervention is no longer needed.   [name] [name], MSW, LCSWA [phone] 
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
## 2014-03-15 NOTE — Progress Notes (Signed)
Physical Therapy Treatment Patient Details Name: [name] [name] MRN: [id number] DOB: Jan 02, 1929 Today's Date: 03/15/2014    History of Present Illness 80 y.o. male admitted to [facility] on 03/03/14 with 80 yo male with hx HTN, DM, CAD, MI presented 3/12 after being found unresponsive.  Pt last seen ~5 days prior to admit by family at which time he was reportedly confused, not himself.  Neighbors called EMS d/t multiple newspapers outside.  Unresponsive, hypotensive, hypothermic on EMS arrival, found in urine and fesces with multiple pressure ulcers.  ?DKA. BP 80's in ER despite 3L fluids, hypothermic with temp 91.   The pt was dx with septic shock, acute renal failure, DKA, CAD/NSTEMI with h/o CABG.  Cath 3/13 showed severe native 3 vessel CAD with total occlusion of the LM and RCA , patency of the free RIMA-LAD, LIMA-diagonal, and SVG-PDA, total occlusion of the SVG-LCx (appears chronic) and low LVEDP most consistent with septic/vasodiliatory shock.  He also has a contusion on the left side of his forhead.      PT Comments    Pt continues to be weak and debilitated. He has poor sitting tolerance due to back pain and weakness.  We attempted standing using the steady and he was unable to even clear his bottom off of the bed with max assist and both hands pulling up on the steady from an elevated bed.  He is due to d/c to SNF for rehab today.    Follow Up Recommendations  SNF     Equipment Recommendations  Wheelchair (measurements PT);Wheelchair cushion (measurements PT);Hospital bed;Other (comment)    Recommendations for Other Services   None      Precautions / Restrictions Precautions Precautions: Fall Precaution Comments: very weak and debilitated    Mobility  Bed Mobility Overal bed mobility: Needs Assistance Bed Mobility: Supine to Sit;Sit to Supine     Supine to sit: Max assist Sit to supine: Max assist   General bed mobility comments: Max assist to go supine to sit and vice versa.  Assist needed to support trunk, weight shift hips and progress legs over EOB.  Pt attempting to help by pulling on bed rail, but arms are so weak he is not of much assistance.   Transfers Overall transfer level: Needs assistance Equipment used:  (steady) Transfers: Sit to/from Stand Sit to Stand: Max assist         General transfer comment: Attempted to stand from elevated surface with knees blocked by steady. Pt unable to even clear bottom off of bed to attempt standing.               Balance Overall balance assessment: Needs assistance Sitting-balance support: Bilateral upper extremity supported;Feet supported Sitting balance-Leahy Scale: Poor Sitting balance - Comments: Mod assist to maintain sitting balance EOB.  Pt leaning on the steady and unable to maintain upright posture.  He frequently asks to lay back down and reports back pain.  RN made aware of back pain.   Postural control: Right lateral lean                          Cognition Arousal/Alertness: Lethargic Behavior During Therapy: WFL for tasks assessed/performed Overall Cognitive Status: Impaired/Different from baseline Area of Impairment: Attention;Memory;Following commands;Safety/judgement;Awareness;Problem solving Orientation Level: Disoriented to;Situation (pt able to report place and refers to calendar for time) Current Attention Level: Sustained Memory: Decreased short-term memory Following Commands: Follows one step commands with increased time Safety/Judgement: Decreased awareness of  safety;Decreased awareness of deficits Awareness: Intellectual Problem Solving: Slow processing;Decreased initiation;Difficulty sequencing;Requires verbal cues;Requires tactile cues General Comments: More oriented today, but continues to have significant cognitive deficits.            Pertinent Vitals/Pain See vitals flow sheet.            PT Goals (current goals can now be found in the care plan  section) Acute Rehab PT Goals Patient Stated Goal: to go home Progress towards PT goals: Not progressing toward goals - comment (due to pain and weakness, decreased sitting tolerance)    Frequency  Min 2X/week    PT Plan Current plan remains appropriate    End of Session   Activity Tolerance: Patient limited by fatigue;Patient limited by pain Patient left: in bed;with call bell/phone within reach     Time: [id number] PT Time Calculation (min): 23 min  Charges:  $Therapeutic Activity: 23-37 mins            [name] B. Frederick Klinger, PT, DPT [phone]   03/15/2014, 4:32 PM

## 2014-03-15 NOTE — Progress Notes (Signed)
[facility]

## 2014-03-15 NOTE — Progress Notes (Signed)
CSW spoke with patient's grandaughter this am and she was OK with [facility]. Sheagreed to set up a time with [facility] to sign paperwork.    2:10 [facility] contacted CSW and reported that the granddaughter cannot come and sign paperwork until 03/16/14. [facility] stated that they cannot accept the patient unless the granddaughter agrees to sign paperwork today.  2:15. CSW contacted patient's granddaughter and she became very upset and stated, "I cannot sign paperwork today". She said she has docotor' appointments, a young child. CSW asked if there were any other family members that could sign the patient in. Granddaughter said no.   Sabino NiemannAmy Legacie Dillingham, MSW, [employer] [phone]

## 2014-03-15 NOTE — Progress Notes (Signed)
Utilization review completed.  

## 2014-03-15 NOTE — Progress Notes (Signed)
CSW met with patient and patient was alert and oriented 3x. He was able to name place, time and president. CSW contacted pt's granddaughter if she would be agreeable to the patient signing himself;f in. Pt's granddaughter is agreeable to the patient signing himself into the facility. Facility is OK with patient signing in.  CSW re-called [facility] for transport. Clinical Social Worker will sign off for now as social work intervention is no longer needed. Please consult us again if new need arises.    , MSW, LCSWA [phone] 

## 2014-03-15 NOTE — Progress Notes (Signed)
Spoke with patient's granddaughter [name] whom is concerned patient is having visitors who he should not have.  Request that staff screen his visitors and not allow "Black or Alisa" visit him.  Password for the niece is "Jeral FruitRobert [name]".  Niece informed patient for [facility].  Information passed on to nurse Inetta Fermoina.  Will continue to monitor.  Colman Caterarpley, Antwoine Zorn Danielle

## 2014-03-16 ENCOUNTER — Encounter (HOSPITAL_COMMUNITY): Payer: Self-pay | Admitting: Emergency Medicine

## 2014-03-17 DIAGNOSIS — R531 Weakness: Secondary | ICD-10-CM

## 2014-03-17 DIAGNOSIS — Z515 Encounter for palliative care: Secondary | ICD-10-CM

## 2014-03-19 LAB — CULTURE, BLOOD (ROUTINE X 2): Culture: NO GROWTH

## 2014-03-20 LAB — CULTURE, BLOOD (ROUTINE X 2): CULTURE: NO GROWTH

## 2014-04-27 ENCOUNTER — Encounter: Payer: Self-pay | Admitting: Interventional Cardiology

## 2014-06-16 ENCOUNTER — Ambulatory Visit: Payer: PRIVATE HEALTH INSURANCE | Admitting: Interventional Cardiology

## 2014-06-22 DEATH — deceased

## 2014-09-02 ENCOUNTER — Encounter: Payer: Self-pay | Admitting: *Deleted

## 2014-12-01 ENCOUNTER — Encounter (HOSPITAL_COMMUNITY): Payer: Self-pay | Admitting: Cardiovascular Disease
